# Patient Record
Sex: Female | Born: 1990 | Race: White | Hispanic: No | Marital: Married | Smoking: Never smoker
Health system: Southern US, Community
[De-identification: ages and names within clinical notes are randomized; demographics above are authoritative.]

## PROBLEM LIST (undated history)

## (undated) ENCOUNTER — Emergency Department (HOSPITAL_COMMUNITY): Discharge status: Absent without leave | Payer: Medicaid Other

## (undated) ENCOUNTER — Inpatient Hospital Stay: Payer: Self-pay

## (undated) DIAGNOSIS — T782XXA Anaphylactic shock, unspecified, initial encounter: Secondary | ICD-10-CM

## (undated) DIAGNOSIS — Z9289 Personal history of other medical treatment: Secondary | ICD-10-CM

## (undated) HISTORY — DX: Personal history of other medical treatment: Z92.89

## (undated) HISTORY — PX: NO PAST SURGERIES: SHX2092

---

## 2011-11-08 ENCOUNTER — Emergency Department: Payer: Self-pay | Admitting: Emergency Medicine

## 2012-07-27 ENCOUNTER — Observation Stay: Payer: Self-pay

## 2012-08-20 ENCOUNTER — Inpatient Hospital Stay: Payer: Self-pay | Admitting: Obstetrics and Gynecology

## 2012-08-20 LAB — CBC WITH DIFFERENTIAL/PLATELET
Basophil #: 0.1 10*3/uL (ref 0.0–0.1)
HCT: 35.5 % (ref 35.0–47.0)
HGB: 12.3 g/dL (ref 12.0–16.0)
Lymphocyte #: 2.6 10*3/uL (ref 1.0–3.6)
Lymphocyte %: 17.6 %
MCHC: 34.8 g/dL (ref 32.0–36.0)
MCV: 91 fL (ref 80–100)
Monocyte #: 1.3 x10 3/mm — ABNORMAL HIGH (ref 0.2–0.9)
Monocyte %: 8.5 %
Neutrophil #: 10.8 10*3/uL — ABNORMAL HIGH (ref 1.4–6.5)
Neutrophil %: 72.5 %
RDW: 13.6 % (ref 11.5–14.5)

## 2012-08-21 LAB — HEMATOCRIT: HCT: 34.9 % — ABNORMAL LOW (ref 35.0–47.0)

## 2014-06-19 NOTE — H&P (Signed)
L&D Evaluation:  History Expanded:  HPI 24 year old G1 P0 with EDC=08/13/2012 by LMP=11/07/2011 presented at 41 weeks with c/o SROM at 0300 this am. No VB, she is beginning to have contractions that are mildly painful. +FM. [facility] has been uncomplicated with early US confirming dates.   Blood Type (Maternal) A positive   Group B Strep Results Maternal (Result >5wks must be treated as unknown) negative   Maternal HIV Negative   Maternal Syphilis Ab Nonreactive   Maternal Varicella Immune   Rubella Results (Maternal) immune   Presents with leaking fluid   Patient's Medical History No Chronic Illness   Patient's Surgical History none   Medications Pre Natal Vitamins   Allergies NKDA, Fish   Social History none   Family History Non-Contributory   Exam:  Vital Signs stable   Urine Protein not completed   General no apparent distress   Mental Status clear   Chest clear   Heart no murmur/gallop/rubs   Abdomen gravid, tender with contractions   Estimated Fetal Weight Average for gestational age   Fetal Position cephalic   Edema no edema   Pelvic no external lesions, 2.5/60/-2   Mebranes Intact   FHT category 1 tracing   Ucx regular   Skin dry   Impression:  Impression SROM   Plan:  Comments Pt undecided about pain management plans at this point. Will ambulate in hallways with intermittant monitoring to assist in strengthening contractions.   Follow Up Appointment in 2 days.   Electronic Signatures: Vella KohlerBrothers, Catera Hankins K (CNM)  (Signed 12-Jul-14 10:38)  Authored: L&D Evaluation   Last Updated: 12-Jul-14 10:38 by Vella KohlerBrothers, Daeveon Zweber K (CNM)

## 2014-06-19 NOTE — H&P (Signed)
L&D Evaluation:  History:  HPI 24 year old G1 P0 with EDC=08/13/2012 by LMP=11/07/2011 pre18sented at 3537 4/[redacted] weeks gestation  s/p fall last night around 10 PM. She tripped over a concrete step falling onto her hands, knees and abdomen. She has not had VB or LOF. Baby not moving as much this AM as usual. Palms and and left knee with abrasions. No bruising of abdomen. Some discomfort in round ligament area. Occasional contraction/tightening. [facility] with early US confirming dates.   Presents with s/p fall for monitoring   Patient's Medical History No Chronic Illness   Medications Pre Natal Vitamins   Allergies NKDA   Social History none   Family History Non-Contributory   Exam:  Vital Signs stable  110/72   Urine Protein not completed   General no apparent distress   Mental Status clear   Abdomen gravid, non-tender   Estimated Fetal Weight Average for gestational age   Fetal Position cephalic   Edema no edema   Mebranes Intact, AFI=3.07+3.54+4.13+2.41cm=13.15cm   FHT 145-150 with accels to 160s to 170s. Baby became active during last half of monitoring   FHT Description MOderate variability   Ucx occasional   Skin dry, abrasions on both palms and left knee with abrasion and swelling. Wounds clean   Other Placenta fundal and posterior-no obvious abnormalities.   Impression:  Impression IUP at 37 4/7 week s/p fall with no evidence of abruption or fetal compromise.   Plan:  Plan DC home. FKCs daily. Discussed using Dermoplast or Neosporin for pain on abrasions and cleaning with soap and water daily.   Follow Up Appointment in 2 days.   Electronic Signatures: Trinna BalloonGutierrez, Dream Harman L (CNM)  (Signed 18-Jun-14 23:30)  Authored: L&D Evaluation   Last Updated: 18-Jun-14 23:30 by Trinna BalloonGutierrez, Ellah Otte L (CNM)

## 2015-02-10 NOTE — L&D Delivery Note (Signed)
Delivery Note At 12:19 AM a viable female was delivered via Vaginal, Spontaneous Delivery (Presentation: OA).  APGAR: 8, 9; weight: pending  .   Placenta status: delivered spontaneous, intact.  Cord: 3VC without complications.    Anesthesia:  Epidural Episiotomy: None Lacerations: 1st degree;Vaginal Suture Repair: 4-0 vicryl Est. Blood Loss (mL): 400  Mom to postpartum.  Baby to Couplet care / Skin to Skin.  Called to see patient.  Mom pushed to deliver a viable female infant.  The head followed by shoulders, which delivered without difficulty, and the rest of the body.  A single, tight nuchal cord noted.  Baby to mom's chest.  Cord clamped and cut after > 1 min delay.  No cord blood obtained.  Placenta delivered spontaneously, intact, with a 3-vessel cord.  Very small, bleeding first degree vaginal laceration repaired with 4-0 Vicryl with figure-of-eight.  All counts correct.  Hemostasis obtained with IV pitocin and fundal massage. EBL 400 mL.    Conard NovakJackson, Stephen D 11/29/2015, 12:56 AM

## 2015-04-11 LAB — HM PAP SMEAR: HM PAP: NEGATIVE

## 2015-10-15 ENCOUNTER — Observation Stay
Admission: EM | Admit: 2015-10-15 | Discharge: 2015-10-16 | Disposition: A | Discharge status: Home / Self Care | Payer: No Typology Code available for payment source | Attending: Obstetrics & Gynecology | Admitting: Obstetrics & Gynecology

## 2015-10-15 ENCOUNTER — Emergency Department
Admission: EM | Admit: 2015-10-15 | Discharge: 2015-10-15 | Disposition: A | Discharge status: Home / Self Care | Payer: No Typology Code available for payment source | Source: Home / Self Care | Attending: Emergency Medicine | Admitting: Emergency Medicine

## 2015-10-15 ENCOUNTER — Encounter: Payer: Self-pay | Admitting: Emergency Medicine

## 2015-10-15 DIAGNOSIS — Y939 Activity, unspecified: Secondary | ICD-10-CM | POA: Insufficient documentation

## 2015-10-15 DIAGNOSIS — Y999 Unspecified external cause status: Secondary | ICD-10-CM | POA: Insufficient documentation

## 2015-10-15 DIAGNOSIS — O26893 Other specified pregnancy related conditions, third trimester: Secondary | ICD-10-CM

## 2015-10-15 DIAGNOSIS — Y92410 Unspecified street and highway as the place of occurrence of the external cause: Secondary | ICD-10-CM

## 2015-10-15 DIAGNOSIS — Z3A35 35 weeks gestation of pregnancy: Secondary | ICD-10-CM | POA: Insufficient documentation

## 2015-10-15 DIAGNOSIS — R1030 Lower abdominal pain, unspecified: Secondary | ICD-10-CM

## 2015-10-15 DIAGNOSIS — Z3483 Encounter for supervision of other normal pregnancy, third trimester: Principal | ICD-10-CM | POA: Insufficient documentation

## 2015-10-15 DIAGNOSIS — Z041 Encounter for examination and observation following transport accident: Secondary | ICD-10-CM | POA: Insufficient documentation

## 2015-10-15 DIAGNOSIS — Z3A34 34 weeks gestation of pregnancy: Secondary | ICD-10-CM | POA: Insufficient documentation

## 2015-10-15 LAB — URINALYSIS COMPLETE WITH MICROSCOPIC (ARMC ONLY)
BACTERIA UA: NONE SEEN
BILIRUBIN URINE: NEGATIVE
Glucose, UA: NEGATIVE mg/dL
Hgb urine dipstick: NEGATIVE
Ketones, ur: NEGATIVE mg/dL
Leukocytes, UA: NEGATIVE
Nitrite: NEGATIVE
PH: 6 (ref 5.0–8.0)
Protein, ur: NEGATIVE mg/dL
RBC / HPF: NONE SEEN RBC/hpf (ref 0–5)
Specific Gravity, Urine: 1.01 (ref 1.005–1.030)
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
## 2015-10-15 NOTE — ED Notes (Signed)
Report given to [employer] RN in L&D. Patient transported upstairs via WC.
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
## 2015-10-15 NOTE — Discharge Instructions (Signed)
Proceed to labor and delivery area for monitoring.
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
## 2015-10-16 NOTE — Discharge Instructions (Signed)
Return to hospital for contractions, leaking of fluid, vaginal bleeding, or decreased fetal movement.

## 2015-11-28 ENCOUNTER — Inpatient Hospital Stay: Payer: Medicaid Other | Admitting: Anesthesiology

## 2015-11-28 ENCOUNTER — Encounter: Payer: Self-pay | Admitting: *Deleted

## 2015-11-28 ENCOUNTER — Inpatient Hospital Stay
Admission: EM | Admit: 2015-11-28 | Discharge: 2015-11-30 | DRG: 775 | Disposition: A | Discharge status: Home / Self Care | Payer: Medicaid Other | Attending: Obstetrics and Gynecology | Admitting: Obstetrics and Gynecology

## 2015-11-28 DIAGNOSIS — O480 Post-term pregnancy: Secondary | ICD-10-CM

## 2015-11-28 DIAGNOSIS — Z3A41 41 weeks gestation of pregnancy: Secondary | ICD-10-CM | POA: Diagnosis not present

## 2015-11-28 DIAGNOSIS — O4693 Antepartum hemorrhage, unspecified, third trimester: Secondary | ICD-10-CM | POA: Diagnosis present

## 2015-11-28 DIAGNOSIS — Z8249 Family history of ischemic heart disease and other diseases of the circulatory system: Secondary | ICD-10-CM | POA: Diagnosis not present

## 2015-11-28 LAB — CBC
HCT: 37.6 % (ref 35.0–47.0)
Hemoglobin: 13 g/dL (ref 12.0–16.0)
MCH: 31.4 pg (ref 26.0–34.0)
MCHC: 34.5 g/dL (ref 32.0–36.0)
MCV: 91.1 fL (ref 80.0–100.0)
PLATELETS: 214 10*3/uL (ref 150–440)
RBC: 4.13 MIL/uL (ref 3.80–5.20)
RDW: 13.9 % (ref 11.5–14.5)
WBC: 12.6 10*3/uL — AB (ref 3.6–11.0)

## 2015-11-28 LAB — TYPE AND SCREEN
ABO/RH(D): A POS
Antibody Screen: NEGATIVE

## 2015-11-28 MED ORDER — ONDANSETRON HCL 4 MG/2ML IJ SOLN: 4.0000 mg | Freq: Four times a day (QID) | INTRAMUSCULAR | Status: DC | PRN

## 2015-11-28 MED ORDER — OXYTOCIN BOLUS FROM INFUSION
500.0000 mL | Freq: Once | INTRAVENOUS | Status: AC
  Administered 2015-11-29: 500 mL via INTRAVENOUS

## 2015-11-28 MED ORDER — LACTATED RINGERS IV SOLN: INTRAVENOUS | Status: DC

## 2015-11-28 MED ORDER — LACTATED RINGERS IV SOLN: 500.0000 mL | INTRAVENOUS | Status: DC | PRN

## 2015-11-28 MED ORDER — TERBUTALINE SULFATE 1 MG/ML IJ SOLN: 0.2500 mg | Freq: Once | INTRAMUSCULAR | Status: DC | PRN

## 2015-11-28 MED ORDER — OXYTOCIN 10 UNIT/ML IJ SOLN
INTRAMUSCULAR | Status: AC
  Filled 2015-11-28: qty 2

## 2015-11-28 MED ORDER — LIDOCAINE HCL (PF) 1 % IJ SOLN
30.0000 mL | INTRAMUSCULAR | Status: AC | PRN
  Administered 2015-11-28: 1 mL via SUBCUTANEOUS

## 2015-11-28 MED ORDER — SOD CITRATE-CITRIC ACID 500-334 MG/5ML PO SOLN
30.0000 mL | ORAL | Status: DC | PRN
  Filled 2015-11-28: qty 30

## 2015-11-28 MED ORDER — OXYTOCIN 40 UNITS IN LACTATED RINGERS INFUSION - SIMPLE MED: 1.0000 m[IU]/min | INTRAVENOUS | Status: DC

## 2015-11-28 MED ORDER — FENTANYL 2.5 MCG/ML W/ROPIVACAINE 0.2% IN NS 100 ML EPIDURAL INFUSION (ARMC-ANES)
EPIDURAL | Status: DC | PRN
  Administered 2015-11-28: 10 mL/h via EPIDURAL

## 2015-11-28 MED ORDER — LIDOCAINE HCL (PF) 1 % IJ SOLN
INTRAMUSCULAR | Status: AC
  Filled 2015-11-28: qty 30

## 2015-11-28 MED ORDER — MISOPROSTOL 200 MCG PO TABS
ORAL_TABLET | ORAL | Status: AC
  Filled 2015-11-28: qty 4

## 2015-11-28 MED ORDER — FENTANYL 2.5 MCG/ML W/ROPIVACAINE 0.2% IN NS 100 ML EPIDURAL INFUSION (ARMC-ANES)
EPIDURAL | Status: AC
  Filled 2015-11-28: qty 100

## 2015-11-28 MED ORDER — AMMONIA AROMATIC IN INHA
RESPIRATORY_TRACT | Status: AC
  Filled 2015-11-28: qty 10

## 2015-11-28 MED ORDER — OXYTOCIN 40 UNITS IN LACTATED RINGERS INFUSION - SIMPLE MED
2.5000 [IU]/h | INTRAVENOUS | Status: DC
  Filled 2015-11-28: qty 1000

## 2015-11-28 MED ORDER — OXYTOCIN 10 UNIT/ML IJ SOLN: 10.0000 [IU] | Freq: Once | INTRAMUSCULAR | Status: DC

## 2015-11-28 NOTE — Anesthesia Procedure Notes (Signed)
Epidural Patient location during procedure: OB Start time: 11/28/2015 6:52 PM End time: 11/28/2015 7:16 PM  Preanesthetic Checklist Completed: patient identified, site marked, surgical consent, pre-op evaluation, timeout performed, IV checked, risks and benefits discussed and monitors and equipment checked  Epidural Patient position: sitting Prep: Betadine Patient monitoring: heart rate, continuous pulse ox and blood pressure Approach: midline Location: L4-L5 Injection technique: LOR saline  Needle:  Needle type: Tuohy  Needle gauge: 17 G Needle length: 9 cm and 9 Catheter type: closed end flexible Catheter size: 19 Gauge Test dose: negative and 1.5% lidocaine with Epi 1:200 K  Assessment Events: blood not aspirated, injection not painful, no injection resistance, negative IV test and no paresthesia  Additional Notes   Patient tolerated the insertion well without complications.Reason for block:procedure for pain

## 2015-11-28 NOTE — H&P (Signed)
OB History & Physical   History of Present Illness:  Chief Complaint: Vaginal bleeding  HPI:  Cristina Le is a 25 y.o. G18P1001 female at [redacted]w[redacted]d dated by her LMP consistent with a 9 week ultrasoudn.  Her pregnancy has been uncomplicated.    She denies contractions.   She denies leakage of fluid.   She reports vaginal bleeding.   She reports fetal movement.   She noted bright red vaginal bleeding this morning at 11 am. She had no provoking incident (no intercourse, etc). She had a cervical exam yesterday, but has had no bleeding since that time.  The states that she does not see a large amount of blood in her underwear.  However, when she wipes after using the bathroom she sees a decent amount on the tissue.  Denies clots.  Denies abdominal pain. Denies drug use, smoking, recent abdominal trauma.    Maternal Medical History:   Past Medical History:  Diagnosis Date  . Medical history non-contributory     Past Surgical History:  Procedure Laterality Date  . NO PAST SURGERIES      Allergies  Allergen Reactions  . Fish Allergy Anaphylaxis    Prior to Admission medications   Medication Sig Start Date End Date Taking? Authorizing Provider  ferrous sulfate (FER-IN-SOL) 75 (15 Fe) MG/ML SOLN Take by mouth.    Historical Provider, MD  Prenatal Vit-Fe Fumarate-FA (MULTIVITAMIN-PRENATAL) 27-0.8 MG TABS tablet Take 1 tablet by mouth daily at 12 noon.    Historical Provider, MD    OB History  Gravida Para Term Preterm AB Living  2 1 1  0 0 1  SAB TAB Ectopic Multiple Live Births  0 0 0 0 1    # Outcome Date GA Lbr Len/2nd Weight Sex Delivery Anes PTL Lv  2 Current           1 Term 08/20/12    F Vag-Spont EPI N LIV      Prenatal care site: [facility]  Social History: She  reports that she has never smoked. She has never used smokeless tobacco. She reports that she does not drink alcohol or use drugs.  Family History: family history includes Breast cancer in her maternal  grandmother; Heart disease in her paternal grandfather.   Review of Systems: Negative x 10 systems reviewed except as noted in the HPI.    Physical Exam:  Vital Signs: BP 115/75 (BP Location: Left Arm)   Pulse 85   Temp 97.5 F (36.4 C) (Oral)   Ht 5\' 8"  (1.727 m)   Wt 180 lb (81.6 kg)   BMI 27.37 kg/m  General: no acute distress.  HEENT: normocephalic, atraumatic Heart: regular rate & rhythm.  No murmurs/rubs/gallops Lungs: clear to auscultation bilaterally Abdomen: soft, gravid, non-tender;  EFW: 7 pounds Pelvic: (female chaperone present during pelvic exam)  External: Normal external female genitalia  Cervix: Dilation: Fingertip / Effacement (%): Thick / Station: -3   ROM: neg pooling; positive for bright red blood in posterior fornix.  No discernible active bleeding from cervix or cervical os. However, after wiping away blood, a small new pool of bright red blood would form. Extremities: non-tender, symmetric, no edema bilaterally.  DTRs: 2+  Neurologic: Alert & oriented x 3.    Pertinent Results:  Prenatal Labs: Blood type/Rh A positive  Antibody screen negative  Rubella Immune  Varicella Immune    RPR NR  HBsAg negative  HIV negative  GC negative  Chlamydia negative  Genetic screening  1st trimester screen negative  1 hour GTT 89  3 hour GTT N/a  GBS negative on 10/24/15   Baseline FHR: 125 beats/min   Variability: moderate   Accelerations: present   Decelerations: absent Contractions: absent frequency: n/a Overall assessment: category 1  Assessment:  [name] is a 25 y.o. 132P1001 female at 3688w1d with active vaginal bleeding of unknown source.  Possible sources could be cervical versus uterine.  In either case, will go ahead and deliver as a negative ultrasound would not rule out placenta abruptio.  Discussed risk of induction vs expectant management.  Discussed possible need for emergent cesarean delivery for worsening maternal or fetal condition.   Plan:   1. Admit to Labor & Delivery  2. CBC, T&S, NPO, IVF 3. GBS negative.   4. Fetwal well-being: reassuring overall 5. IOL, as above.   Conard NovakJackson, Stephen D, MD 11/28/2015 1:58 PM

## 2015-11-28 NOTE — Progress Notes (Signed)
BSUS performed. Fetus is cephalic. Unable to visualize the placenta well due to shadowing effect of fetus. Fluid subjectively normal.   Thomasene MohairStephen Doneisha Ivey, MD 11/28/2015 2:45 PM

## 2015-11-28 NOTE — Anesthesia Preprocedure Evaluation (Signed)
Anesthesia Evaluation  Patient identified by MRN, date of birth, ID band Patient awake    Reviewed: Allergy & Precautions, NPO status , Patient's Chart, lab work & pertinent test results  History of Anesthesia Complications Negative for: history of anesthetic complications  Airway Mallampati: II       Dental   Pulmonary neg pulmonary ROS,           Cardiovascular negative cardio ROS       Neuro/Psych negative neurological ROS     GI/Hepatic negative GI ROS, Neg liver ROS,   Endo/Other  negative endocrine ROS  Renal/GU negative Renal ROS     Musculoskeletal   Abdominal   Peds  Hematology negative hematology ROS (+)   Anesthesia Other Findings   Reproductive/Obstetrics                             Anesthesia Physical Anesthesia Plan  ASA: II  Anesthesia Plan: Epidural   Post-op Pain Management:    Induction:   Airway Management Planned:   Additional Equipment:   Intra-op Plan:   Post-operative Plan:   Informed Consent: I have reviewed the patients History and Physical, chart, labs and discussed the procedure including the risks, benefits and alternatives for the proposed anesthesia with the patient or authorized representative who has indicated his/her understanding and acceptance.     Plan Discussed with:   Anesthesia Plan Comments:         Anesthesia Quick Evaluation

## 2015-11-28 NOTE — Progress Notes (Signed)
Labor Check  Subj:  Complaints: Worsening pain with contractions, desires epidural.   Obj:  BP 115/75 (BP Location: Left Arm)   Pulse 85   Temp 97.5 F (36.4 C) (Oral)   Resp 18   Ht 5\' 8"  (1.727 m)   Wt 180 lb (81.6 kg)   BMI 27.37 kg/m     Cervix: Dilation: 5 / Effacement (%): 90 / Station: -2  Baseline FHR: 130    Variability: moderate    Accelerations: present    Decelerations: absent Contractions: present frequency: 4 q 10 min  A/P: 25 y.o. G2P1001 female at 3717w1d with vaginal bleeding.  1.  Labor: continue pitocin per protocol. Currently on 1.  2.  FWB: reassuring, Overall assessment: category 1  3.  GBS negative  4.  Pain: prn, may have epidural any time 5.  Recheck: prn.   Thomasene MohairStephen Tre Sanker, MD 11/28/2015 6:32 PM

## 2015-11-28 NOTE — Progress Notes (Signed)
Patient ID: Cristina Le, female   DOB: 1990-02-27, 25 y.o.   MRN: [id number]  Labor Check  Subj:  Complaints: patient thinks her water broke. Patient was lying in bed when large gush of fluid was noted. It was red in color and was without clots.  Obj:  BP 115/75 (BP Location: Left Arm)   Pulse 85   Temp 97.5 F (36.4 C) (Oral)   Resp 18   Ht 5\' 8"  (1.727 m)   Wt 180 lb (81.6 kg)   BMI 27.37 kg/m     Cervix: Dilation: 2 / Effacement (%): 80 / Station: -2  Baseline FHR: 130    Variability: moderate    Accelerations: present    Decelerations: absent Contractions: present frequency: 3-4 q 10 min  Perineum with some thin fluid, with port-wine coloring nitrazine not done due to blood Ferning: equiv  A/P: 25 y.o. G2P1001 female at 20102w1d with IOL for vaginal bleeding.  1.  Labor: pitocin at 1. Patient contracting. Continue at lowest effective dose.  SROM likely. Will continue as if she has ROM. 2.  FWB: reactive, Overall assessment: category 1  3.  GBS negative  4.  Pain: prn. May get epidural when ready. 5.  Recheck: prn   Thomasene MohairStephen Oktober Glazer, MD 11/28/2015 5:04 PM

## 2015-11-29 LAB — RPR: RPR Ser Ql: NONREACTIVE

## 2015-11-29 MED ORDER — ONDANSETRON HCL 4 MG/2ML IJ SOLN: 4.0000 mg | Freq: Three times a day (TID) | INTRAMUSCULAR | Status: DC | PRN

## 2015-11-29 MED ORDER — SODIUM CHLORIDE 0.9% FLUSH: 3.0000 mL | INTRAVENOUS | Status: DC | PRN

## 2015-11-29 MED ORDER — DIPHENHYDRAMINE HCL 50 MG/ML IJ SOLN: 12.5000 mg | INTRAMUSCULAR | Status: DC | PRN

## 2015-11-29 MED ORDER — ACETAMINOPHEN 325 MG PO TABS
650.0000 mg | ORAL_TABLET | ORAL | Status: DC | PRN
  Administered 2015-11-30: 650 mg via ORAL
  Filled 2015-11-29: qty 2

## 2015-11-29 MED ORDER — NALOXONE HCL 0.4 MG/ML IJ SOLN: 0.4000 mg | INTRAMUSCULAR | Status: DC | PRN

## 2015-11-29 MED ORDER — NALOXONE HCL 2 MG/2ML IJ SOSY: 1.0000 ug/kg/h | PREFILLED_SYRINGE | INTRAVENOUS | Status: DC | PRN

## 2015-11-29 MED ORDER — IBUPROFEN 600 MG PO TABS
600.0000 mg | ORAL_TABLET | Freq: Four times a day (QID) | ORAL | Status: DC | PRN
  Administered 2015-11-29: 600 mg via ORAL
  Filled 2015-11-29: qty 1

## 2015-11-29 MED ORDER — ONDANSETRON HCL 4 MG PO TABS: 4.0000 mg | ORAL_TABLET | ORAL | Status: DC | PRN

## 2015-11-29 MED ORDER — HYDROCODONE-ACETAMINOPHEN 5-325 MG PO TABS: 1.0000 | ORAL_TABLET | Freq: Four times a day (QID) | ORAL | Status: DC | PRN

## 2015-11-29 MED ORDER — BENZOCAINE-MENTHOL 20-0.5 % EX AERO: 1.0000 "application " | INHALATION_SPRAY | CUTANEOUS | Status: DC | PRN

## 2015-11-29 MED ORDER — NALBUPHINE HCL 10 MG/ML IJ SOLN: 5.0000 mg | Freq: Once | INTRAMUSCULAR | Status: DC | PRN

## 2015-11-29 MED ORDER — DIPHENHYDRAMINE HCL 25 MG PO CAPS: 25.0000 mg | ORAL_CAPSULE | Freq: Four times a day (QID) | ORAL | Status: DC | PRN

## 2015-11-29 MED ORDER — FENTANYL 2.5 MCG/ML W/ROPIVACAINE 0.2% IN NS 100 ML EPIDURAL INFUSION (ARMC-ANES): 10.0000 mL/h | EPIDURAL | Status: DC

## 2015-11-29 MED ORDER — WITCH HAZEL-GLYCERIN EX PADS: 1.0000 "application " | MEDICATED_PAD | CUTANEOUS | Status: DC | PRN

## 2015-11-29 MED ORDER — COCONUT OIL OIL
1.0000 "application " | TOPICAL_OIL | Status: DC | PRN
  Administered 2015-11-30: 1 via TOPICAL
  Filled 2015-11-29: qty 120

## 2015-11-29 MED ORDER — NALBUPHINE HCL 10 MG/ML IJ SOLN: 5.0000 mg | INTRAMUSCULAR | Status: DC | PRN

## 2015-11-29 MED ORDER — DIPHENHYDRAMINE HCL 25 MG PO CAPS: 25.0000 mg | ORAL_CAPSULE | ORAL | Status: DC | PRN

## 2015-11-29 MED ORDER — ONDANSETRON HCL 4 MG/2ML IJ SOLN: 4.0000 mg | INTRAMUSCULAR | Status: DC | PRN

## 2015-11-29 MED ORDER — DIBUCAINE 1 % RE OINT: 1.0000 "application " | TOPICAL_OINTMENT | RECTAL | Status: DC | PRN

## 2015-11-29 MED ORDER — FERROUS SULFATE 325 (65 FE) MG PO TABS
325.0000 mg | ORAL_TABLET | Freq: Two times a day (BID) | ORAL | Status: DC
  Administered 2015-11-29 – 2015-11-30 (×3): 325 mg via ORAL
  Filled 2015-11-29 (×3): qty 1

## 2015-11-29 MED ORDER — SIMETHICONE 80 MG PO CHEW: 80.0000 mg | CHEWABLE_TABLET | ORAL | Status: DC | PRN

## 2015-11-29 MED ORDER — SCOPOLAMINE 1 MG/3DAYS TD PT72: 1.0000 | MEDICATED_PATCH | Freq: Once | TRANSDERMAL | Status: DC

## 2015-11-29 MED ORDER — SENNOSIDES-DOCUSATE SODIUM 8.6-50 MG PO TABS
2.0000 | ORAL_TABLET | ORAL | Status: DC
  Administered 2015-11-30: 2 via ORAL
  Filled 2015-11-29: qty 2

## 2015-11-29 MED ORDER — IBUPROFEN 600 MG PO TABS
600.0000 mg | ORAL_TABLET | Freq: Four times a day (QID) | ORAL | Status: DC
  Administered 2015-11-29 – 2015-11-30 (×5): 600 mg via ORAL
  Filled 2015-11-29 (×5): qty 1

## 2015-11-29 NOTE — Progress Notes (Signed)
Post Partum Day 0 Subjective: Doing well, no complaints.  Tolerating regular diet, pain with PO meds, voiding and ambulating without difficulty.  No CP SOB F/C N/V or leg pain No HA, change of vision, RUQ/epigastric pain  Objective: BP 112/70   Pulse 79   Temp 97.9 F (36.6 C) (Oral)   Resp 18   Ht 5\' 8"  (1.727 m)   Wt 180 lb (81.6 kg)   SpO2 99%   BMI 27.37 kg/m   Physical Exam:  General: NAD CV: RRR Pulm: nl effort, CTABL Lochia: moderate Uterine Fundus: fundus firm and below umbilicus DVT Evaluation: no cords, ttp LEs    Recent Labs  11/28/15 1454  HGB 13.0  HCT 37.6  WBC 12.6*  PLT 214    Assessment/Plan: 25 y.o. G2P1001 postpartum day # 0  1. Continue routine postpartum care 2. A+, Rubella Immune, Varicella Immune 3. TDAP status: declined 4. Breast/Contraception: uncertain 5. Disposition: Discharge to home Day 1 or 2    Taran Haynesworth, CNM

## 2015-11-29 NOTE — Discharge Summary (Signed)
OB Discharge Summary     Patient Name: Cristina Le DOB: 1990-03-19 MRN: [id number]  Date of admission: 11/28/2015 Delivering MD: Conard Novak, MD  Date of Delivery: 11/29/2015  Date of discharge: 11/30/15  Admitting diagnosis:  1) intrauterine device at [redacted]w[redacted]d  2) vaginal bleeding  Intrauterine pregnancy: [redacted]w[redacted]d      Secondary diagnosis: None     Discharge diagnosis: Term Pregnancy Delivered                                                                                                Post partum procedures:none  Augmentation: Pitocin  Complications: None  Hospital course:  Induction of Labor With Vaginal Delivery   25 y.o. yo G2P1001 at [redacted]w[redacted]d was admitted to the hospital 11/28/2015 for induction of labor.  Indication for induction: Postdates and vaginal bleedin.  Patient was admitted and pitocin was started.  Her vaginal bleeding was mild, but persistent.  She otherwise had an uncomplicated labor course as follows: Membrane Rupture Time/Date: 12:03 AM ,11/29/2015   Intrapartum Procedures: Episiotomy: None [1]                                         Lacerations:  1st degree [2];Vaginal [6]  Patient had delivery of a Viable infant.  Information for the patient's newborn:  Michaelyn, Wall [[id number]]  Delivery Method: Vaginal, Spontaneous Delivery (Filed from Delivery Summary)   11/29/2015  Details of delivery can be found in separate delivery note.  Patient had a routine postpartum course. Patient is discharged home 11/29/15.   Physical exam  Vitals:   11/29/15 1553 11/29/15 1859 11/30/15 0746 11/30/15 0759  BP: 99/65 106/63 97/69 (!) 95/56  Pulse: 82 80 (!) 119 72  Resp: 18 20 18 18   Temp: 98 F (36.7 C) 98.8 F (37.1 C) 97.5 F (36.4 C) 98.1 F (36.7 C)  TempSrc: Oral Oral Oral Oral  SpO2:  100% 99% 99%  Weight:      Height:       General: alert, cooperative and no distress Lochia: appropriate Uterine Fundus: firm Incision: N/A DVT Evaluation:  No evidence of DVT seen on physical exam. No cords or calf tenderness. No significant calf/ankle edema.  Labs: Lab Results  Component Value Date   WBC 13.6 (H) 11/30/2015   HGB 12.6 11/30/2015   HCT 37.9 11/30/2015   MCV 93.7 11/30/2015   PLT 201 11/30/2015   No flowsheet data found.  Discharge instruction: per After Visit Summary.  Medications:    Medication List    STOP taking these medications   ferrous sulfate 75 (15 Fe) MG/ML Soln Commonly known as:  FER-IN-SOL     TAKE these medications   ibuprofen 600 MG tablet Commonly known as:  ADVIL,MOTRIN Take 1 tablet (600 mg total) by mouth every 6 (six) hours.   multivitamin-prenatal 27-0.8 MG Tabs tablet Take 1 tablet by mouth daily at 12 noon.        Diet: routine diet  Activity: Advance as tolerated.  Pelvic rest for 6 weeks.   Outpatient follow up: Follow-up Information    Conard NovakJackson, Stephen D, MD Follow up in 6 week(s).   Specialty:  Obstetrics and Gynecology Why:  postpartum check Contact information: [address] [place] [address] [phone]             Postpartum contraception: Undecided Rhogam Given postpartum: no Rubella vaccine given postpartum: no Varicella vaccine given postpartum: no TDaP given antepartum or postpartum: Declined  Newborn Data: Live born female  Birth Weight:  3840 grams APGAR: 8, 9   Baby Feeding: Breast  Disposition:home with mother  SIGNED: Marta AntuBrothers, Evans Levee, Ina HomesCNM 11/30/15 10:11 AM  Seen with L. Zachery ConchFriedman, SNM [facility]

## 2015-11-29 NOTE — Lactation Note (Signed)
This note was copied from a baby's chart. Lactation Consultation Note  Patient Name: Boy Karie Weinman JXBJY'NToday's Date: 11/29/2015 Sheryn Bison    Maternal Data  Did not observe bresatfeeding, pt breastfed first child for 1 yr, states this baby is latching and nursing well and mom hears swallows Feeding Feeding Type: Breast Fed  [facility]                      Lactation Tools Discussed/Used     Consult Status      Dyann KiefMarsha D Thedford Bunton 11/29/2015, 5:55 PM

## 2015-11-30 LAB — CBC
HCT: 37.9 % (ref 35.0–47.0)
HEMOGLOBIN: 12.6 g/dL (ref 12.0–16.0)
MCH: 31.1 pg (ref 26.0–34.0)
MCHC: 33.2 g/dL (ref 32.0–36.0)
MCV: 93.7 fL (ref 80.0–100.0)
PLATELETS: 201 10*3/uL (ref 150–440)
RBC: 4.05 MIL/uL (ref 3.80–5.20)
RDW: 13.8 % (ref 11.5–14.5)
WBC: 13.6 10*3/uL — ABNORMAL HIGH (ref 3.6–11.0)

## 2015-11-30 MED ORDER — IBUPROFEN 600 MG PO TABS: 600.0000 mg | ORAL_TABLET | Freq: Four times a day (QID) | ORAL | 0 refills | Status: DC

## 2015-11-30 NOTE — Anesthesia Postprocedure Evaluation (Signed)
Anesthesia Post Note  Patient: Cristina Le  Procedure(s) Performed: * No procedures listed *  Patient location during evaluation: Mother Baby Anesthesia Type: Epidural Level of consciousness: awake and alert Pain management: pain level controlled Vital Signs Assessment: post-procedure vital signs reviewed and stable Respiratory status: spontaneous breathing, nonlabored ventilation and respiratory function stable Cardiovascular status: stable Postop Assessment: no headache, no backache and patient able to bend at knees Anesthetic complications: no    Last Vitals:  Vitals:   11/30/15 0746 11/30/15 0759  BP: 97/69 (!) 95/56  Pulse: (!) 119 72  Resp: 18 18  Temp: 36.4 C 36.7 C    Last Pain:  Vitals:   11/30/15 0759  TempSrc: Oral  PainSc: 1                  Cleda MccreedyJoseph K Keslie Gritz

## 2015-11-30 NOTE — Progress Notes (Signed)
Patient discharged home with infant and spouse. Discharge instructions, prescriptions and follow up appointment given to and reviewed with patient and spouse. Patient verbalized understanding. Escorted out via wheelchair by United AutoMelissa Penland, NT.

## 2017-01-01 ENCOUNTER — Encounter: Payer: Self-pay | Admitting: Emergency Medicine

## 2017-01-01 ENCOUNTER — Emergency Department
Admission: EM | Admit: 2017-01-01 | Discharge: 2017-01-01 | Disposition: A | Discharge status: Home / Self Care | Payer: Medicaid Other | Attending: Emergency Medicine | Admitting: Emergency Medicine

## 2017-01-01 DIAGNOSIS — O99712 Diseases of the skin and subcutaneous tissue complicating pregnancy, second trimester: Secondary | ICD-10-CM | POA: Diagnosis not present

## 2017-01-01 DIAGNOSIS — O2686 Pruritic urticarial papules and plaques of pregnancy (PUPPP): Secondary | ICD-10-CM | POA: Diagnosis not present

## 2017-01-01 DIAGNOSIS — Z79899 Other long term (current) drug therapy: Secondary | ICD-10-CM | POA: Insufficient documentation

## 2017-01-01 DIAGNOSIS — R112 Nausea with vomiting, unspecified: Secondary | ICD-10-CM | POA: Diagnosis not present

## 2017-01-01 DIAGNOSIS — T782XXA Anaphylactic shock, unspecified, initial encounter: Secondary | ICD-10-CM | POA: Diagnosis not present

## 2017-01-01 DIAGNOSIS — R197 Diarrhea, unspecified: Secondary | ICD-10-CM | POA: Diagnosis not present

## 2017-01-01 DIAGNOSIS — O26891 Other specified pregnancy related conditions, first trimester: Secondary | ICD-10-CM | POA: Diagnosis not present

## 2017-01-01 DIAGNOSIS — Z3A11 11 weeks gestation of pregnancy: Secondary | ICD-10-CM | POA: Insufficient documentation

## 2017-01-01 DIAGNOSIS — O9989 Other specified diseases and conditions complicating pregnancy, childbirth and the puerperium: Secondary | ICD-10-CM | POA: Diagnosis not present

## 2017-01-01 DIAGNOSIS — T7840XA Allergy, unspecified, initial encounter: Secondary | ICD-10-CM | POA: Diagnosis not present

## 2017-01-01 LAB — CBC WITH DIFFERENTIAL/PLATELET
BASOS ABS: 0 10*3/uL (ref 0–0.1)
Basophils Relative: 0 %
Eosinophils Absolute: 0.1 10*3/uL (ref 0–0.7)
Eosinophils Relative: 0 %
HEMATOCRIT: 43.7 % (ref 35.0–47.0)
Hemoglobin: 14.8 g/dL (ref 12.0–16.0)
LYMPHS PCT: 10 %
Lymphs Abs: 1.9 10*3/uL (ref 1.0–3.6)
MCH: 30.3 pg (ref 26.0–34.0)
MCHC: 33.8 g/dL (ref 32.0–36.0)
MCV: 89.8 fL (ref 80.0–100.0)
Monocytes Absolute: 0.7 10*3/uL (ref 0.2–0.9)
Monocytes Relative: 4 %
NEUTROS ABS: 15.8 10*3/uL — AB (ref 1.4–6.5)
Neutrophils Relative %: 86 %
PLATELETS: 294 10*3/uL (ref 150–440)
RBC: 4.87 MIL/uL (ref 3.80–5.20)
RDW: 12.6 % (ref 11.5–14.5)
WBC: 18.5 10*3/uL — AB (ref 3.6–11.0)

## 2017-01-01 LAB — COMPREHENSIVE METABOLIC PANEL
ALT: 11 U/L — AB (ref 14–54)
AST: 16 U/L (ref 15–41)
Albumin: 3.9 g/dL (ref 3.5–5.0)
Alkaline Phosphatase: 36 U/L — ABNORMAL LOW (ref 38–126)
Anion gap: 10 (ref 5–15)
BILIRUBIN TOTAL: 0.4 mg/dL (ref 0.3–1.2)
BUN: 18 mg/dL (ref 6–20)
CHLORIDE: 104 mmol/L (ref 101–111)
CO2: 22 mmol/L (ref 22–32)
CREATININE: 0.8 mg/dL (ref 0.44–1.00)
Calcium: 9.1 mg/dL (ref 8.9–10.3)
GFR calc Af Amer: >60 mL/min (ref >60)
Glucose, Bld: 128 mg/dL — ABNORMAL HIGH (ref 65–99)
Potassium: 3.8 mmol/L (ref 3.5–5.1)
Sodium: 136 mmol/L (ref 135–145)
Total Protein: 7.1 g/dL (ref 6.5–8.1)

## 2017-01-01 LAB — LIPASE, BLOOD: LIPASE: 39 U/L (ref 11–51)

## 2017-01-01 MED ORDER — METHYLPREDNISOLONE SODIUM SUCC 125 MG IJ SOLR
125.0000 mg | Freq: Once | INTRAMUSCULAR | Status: AC
  Administered 2017-01-01: 125 mg via INTRAVENOUS
  Filled 2017-01-01: qty 2

## 2017-01-01 MED ORDER — EPINEPHRINE 0.3 MG/0.3ML IJ SOAJ
0.3000 mg | Freq: Once | INTRAMUSCULAR | Status: AC
  Administered 2017-01-01: 0.3 mg via INTRAMUSCULAR
  Filled 2017-01-01: qty 0.3

## 2017-01-01 MED ORDER — SODIUM CHLORIDE 0.9 % IV BOLUS (SEPSIS)
1000.0000 mL | INTRAVENOUS | Status: AC
  Administered 2017-01-01: 1000 mL via INTRAVENOUS

## 2017-01-01 MED ORDER — PREDNISONE 20 MG PO TABS: 60.0000 mg | ORAL_TABLET | Freq: Every day | ORAL | 0 refills | Status: DC

## 2017-01-01 MED ORDER — ONDANSETRON 4 MG PO TBDP: 4.0000 mg | ORAL_TABLET | Freq: Three times a day (TID) | ORAL | 0 refills | Status: DC | PRN

## 2017-01-01 MED ORDER — ONDANSETRON HCL 4 MG/2ML IJ SOLN
4.0000 mg | Freq: Once | INTRAMUSCULAR | Status: AC
  Administered 2017-01-01: 4 mg via INTRAVENOUS
  Filled 2017-01-01: qty 2

## 2017-01-01 MED ORDER — LOPERAMIDE HCL 2 MG PO CAPS
4.0000 mg | ORAL_CAPSULE | Freq: Once | ORAL | Status: AC
  Administered 2017-01-01: 4 mg via ORAL
  Filled 2017-01-01: qty 2

## 2017-01-01 MED ORDER — EPINEPHRINE 0.3 MG/0.3ML IJ SOAJ: 0.3000 mg | Freq: Once | INTRAMUSCULAR | 0 refills | Status: AC

## 2017-01-01 MED ORDER — PREDNISONE 10 MG (21) PO TBPK: ORAL_TABLET | Freq: Every day | ORAL | 0 refills | Status: DC

## 2017-01-01 MED ORDER — ONDANSETRON HCL 4 MG/2ML IJ SOLN
4.0000 mg | INTRAMUSCULAR | Status: AC
  Administered 2017-01-01: 4 mg via INTRAVENOUS
  Filled 2017-01-01: qty 2

## 2017-01-01 NOTE — Discharge Instructions (Signed)

## 2017-01-01 NOTE — ED Provider Notes (Signed)
[facility] Emergency Department Provider Note  ____________________________________________   First MD Initiated Contact with Patient 01/01/17 [id number]     (approximate)  I have reviewed the triage vital signs and the nursing notes.   HISTORY  Chief Complaint Allergic Reaction    HPI Cristina Le is a 26 y.o. female G3P2 at approximately [redacted] weeks gestation who [facility].  Presents by EMS for probable allergic reaction.  She reportedly had acute onset of nausea and vomiting and multiple episodes of diarrhea as well as having hives on her arms and legs.  EMS gave Benadryl 50 mg IV on the way to the hospital and her hives had resolved by the time I saw her.  She had at least 6 or 7 episodes of vomiting and 2 or 3 episodes of diarrhea.  She denies vaginal bleeding, chest pain, shortness of breath, throat swelling, tongue swelling, and dysuria.  She reportedly did have an episode of syncope or near syncope at home.  She states she has had 2 similar episodes in the past over the last few years.  She states that the hives were very itchy and the only thing to which she is allergic and of which she is aware is finished and she has not come in contact with fish tonight.  Her symptoms were severe and acute in onset but are improving after the Benadryl.  Nothing in particular made them worse.   Past Medical History:  Diagnosis Date  . Medical history non-contributory     Patient Active Problem List   Diagnosis Date Noted  . Labor and delivery, indication for care 11/28/2015  . Vaginal bleeding in pregnancy, third trimester 11/28/2015  . [redacted] weeks gestation of pregnancy 11/28/2015    Past Surgical History:  Procedure Laterality Date  . NO PAST SURGERIES      Prior to Admission medications   Medication Sig Start Date End Date Taking? Authorizing Provider  Prenatal Vit-Fe Fumarate-FA (MULTIVITAMIN-PRENATAL) 27-0.8 MG TABS tablet Take 1  tablet by mouth daily at 12 noon.   Yes [provider]  EPINEPHrine (EPIPEN 2-PAK) 0.3 mg/0.3 mL IJ SOAJ injection Inject 0.3 mLs (0.3 mg total) into the muscle once for 1 dose. Take for severe allergic reaction, then come immediately to the Emergency Department or call 911. 01/01/17 01/01/17  Loleta RoseForbach, Teleah Villamar, MD  predniSONE (DELTASONE) 20 MG tablet Take 3 tablets (60 mg total) by mouth daily. 01/01/17   Loleta RoseForbach, Zackary Mckeone, MD    Allergies Fish allergy; Fish-derived products; and Other  Family History  Problem Relation Age of Onset  . Breast cancer Maternal Grandmother   . Heart disease Paternal Grandfather     Social History Social History   Tobacco Use  . Smoking status: Never Smoker  . Smokeless tobacco: Never Used  Substance Use Topics  . Alcohol use: No  . Drug use: No    Review of Systems Constitutional: No fever/chills Cardiovascular: Denies chest pain. Respiratory: Denies shortness of breath. Gastrointestinal: Abdominal cramping with multiple episodes of vomiting and several episodes of diarrhea Genitourinary: Negative for dysuria. Musculoskeletal: Negative for neck pain.  Negative for back pain. Integumentary: Negative for rash. Neurological: Negative for headaches, focal weakness or numbness.   ____________________________________________   PHYSICAL EXAM:  VITAL SIGNS: ED Triage Vitals  Enc Vitals Group     BP 01/01/17 0215 107/76     Pulse Rate 01/01/17 0221 75     Resp 01/01/17 0221 13  Temp 01/01/17 0215 (!) 97.3 F (36.3 C)     Temp Source 01/01/17 0215 Oral     SpO2 01/01/17 0221 100 %     Weight 01/01/17 0216 63.5 kg (140 lb)     Height 01/01/17 0216 1.727 m (5\' 8" )     Head Circumference --      Peak Flow --      Pain Score --      Pain Loc --      Pain Edu? --      Excl. in GC? --     Constitutional: Alert and oriented.  Appears uncomfortable but nontoxic Eyes: Conjunctivae are normal.  Head: Atraumatic. Nose: No  congestion/rhinnorhea. Mouth/Throat: Mucous membranes are moist. Neck: No stridor.  No meningeal signs.   Cardiovascular: Normal rate, regular rhythm. Good peripheral circulation. Grossly normal heart sounds. Respiratory: Normal respiratory effort.  No retractions. Lungs CTAB. Gastrointestinal: Soft and nontender. No distention.  Musculoskeletal: No lower extremity tenderness nor edema. No gross deformities of extremities. Neurologic:  Normal speech and language. No gross focal neurologic deficits are appreciated.  Skin:  Skin is warm, dry and intact. No rash noted. Psychiatric: Mood and affect are normal. Speech and behavior are normal.  Sleepy after Benadryl but otherwise appropriate  ____________________________________________   LABS (all labs ordered are listed, but only abnormal results are displayed)  Labs Reviewed  CBC WITH DIFFERENTIAL/PLATELET - Abnormal; Notable for the following components:      Result Value   WBC 18.5 (*)    Neutro Abs 15.8 (*)    All other components within normal limits  COMPREHENSIVE METABOLIC PANEL - Abnormal; Notable for the following components:   Glucose, Bld 128 (*)    ALT 11 (*)    Alkaline Phosphatase 36 (*)    All other components within normal limits  LIPASE, BLOOD   ____________________________________________  EKG  ED ECG REPORT I, Loleta Roseory Tedi Hughson, the attending physician, personally viewed and interpreted this ECG.  Date: 01/01/2017 EKG Time: 02:18 Rate: 79 Rhythm: normal sinus rhythm QRS Axis: normal Intervals: short PR interval of 106 ms w/ LAFB ST/T Wave abnormalities: normal Narrative Interpretation: no evidence of acute ischemia  ____________________________________________  RADIOLOGY   No results found.  ____________________________________________   PROCEDURES  Critical Care performed: Yes, see critical care procedure note(s)   Procedure(s) performed:   .Critical Care Performed by: Loleta RoseForbach, Caesar Mannella,  MD Authorized by: Loleta RoseForbach, Verdie Wilms, MD   Critical care provider statement:    Critical care time (minutes):  45   Critical care time was exclusive of:  Separately billable procedures and treating other patients   Critical care was necessary to treat or prevent imminent or life-threatening deterioration of the following conditions: anaphylaxis.   Critical care was time spent personally by me on the following activities:  Development of treatment plan with patient or surrogate, discussions with consultants, evaluation of patient's response to treatment, examination of patient, obtaining history from patient or surrogate, ordering and performing treatments and interventions, ordering and review of laboratory studies, ordering and review of radiographic studies, pulse oximetry, re-evaluation of patient's condition and review of old charts     ____________________________________________   INITIAL IMPRESSION / ASSESSMENT AND PLAN / ED COURSE  As part of my medical decision making, I reviewed the following data within the electronic MEDICAL RECORD NUMBER History obtained from family, Nursing notes reviewed and incorporated, Labs reviewed , EKG interpreted  and Patient signed out to Dr. Mayford Knifewilliams.    Differential diagnosis includes viral  gastroenteritis, food poisoning, anaphylaxis, bacterial infection, etc.  She is having no OB related complaints at this time.  Given the presence of hives and acute and severe GI symptoms, I believe that this represents anaphylaxis.  I explained all this to the patient and recommended that we treat her with Solu-Medrol and epinephrine to help her symptoms immediately and over time, but she is very reluctant to take any medications due to her pregnancy.  I explained that I believe there is safe and in her best interest but she wants to hold off; she states that she is already feeling better and the other 2 times that she had similar symptoms she felt better after Benadryl and IV  fluids.  I agreed to hold off for now but if she continues to have persistent GI symptoms I think she would benefit from treatment.  I will check basic labs to verify that her electrolytes are okay and I am giving a liter of normal saline.  Clinical Course as of Jan 02 748  Fri Jan 01, 2017  9147 Labs unremarkable except for leukocytosis which could be due to her symptoms, viral gastroenteritis, anaphylaxis, or acute infection. WBC: (!) 18.5 [CF]  0409 Patient is feeling better, has had one more diarrhea but states that "everything is settling down".  BP is about 98 systolic, but she says this is normal.  Discussed anaphylaxis with her and her husband again, and they still want to hold off on steroids and epi, but she will let me know if her symptoms worsen.  [CF]  [phone] Patient getting worse again, another episode of diarrhea, abdominal cramping, feels short of breath.  Agrees to anaphylaxis treatment.  Will continue to monitor.  [CF]  [id number] Transferring ED care to Dr. Mayford Knife to reassess and determine disposition based on response to treatment.  [CF]    Clinical Course User Index [CF] Loleta Rose, MD    ____________________________________________  FINAL CLINICAL IMPRESSION(S) / ED DIAGNOSES  Final diagnoses:  Anaphylaxis, initial encounter  Nausea vomiting and diarrhea     MEDICATIONS GIVEN DURING THIS VISIT:  Medications  sodium chloride 0.9 % bolus 1,000 mL (0 mLs Intravenous Stopped 01/01/17 0450)  ondansetron (ZOFRAN) injection 4 mg (4 mg Intravenous Given 01/01/17 0707)  EPINEPHrine (EPI-PEN) injection 0.3 mg (0.3 mg Intramuscular Given 01/01/17 0711)  methylPREDNISolone sodium succinate (SOLU-MEDROL) 125 mg/2 mL injection 125 mg (125 mg Intravenous Given 01/01/17 0707)  sodium chloride 0.9 % bolus 1,000 mL (1,000 mLs Intravenous New Bag/Given 01/01/17 0707)     ED Discharge Orders        Ordered    EPINEPHrine (EPIPEN 2-PAK) 0.3 mg/0.3 mL IJ SOAJ injection   Once      01/01/17 0748    predniSONE (DELTASONE) 20 MG tablet  Daily     01/01/17 0748       Note:  This document was prepared using Dragon voice recognition software and may include unintentional dictation errors.    Loleta Rose, MD 01/01/17 [phone]

## 2017-01-01 NOTE — ED Notes (Signed)
Patient is resting comfortably at this time with no signs of distress present. VS stable. Will continue to monitor.   

## 2017-01-01 NOTE — ED Triage Notes (Signed)
Pt presents to ED 16 c/o an allergic reaction to an unknown substance; per EMS, when they arrived on the scene, pt was on the toilet, with nausea and vomiting, and had hives on the legs, and arms; EMS gave 50mg  benadryl; pt also had an episode of diarrhea on the way to the hospital; pt states nausea, vomiting and diarrhea started about half an hour prior to EMS arrival; pt states vomiting around 6-7 times, and 2-3 episodes of diarrhea. Pt is also [redacted] weeks pregnant. Pt is awake, alert and oriented x4.

## 2017-01-01 NOTE — ED Provider Notes (Signed)
Patient seems improved, should be discharged with Zofran and steroids and is stable for outpatient follow-up with her doctor.  It is difficult to tell if this is viral or what the exact etiology is.   Emily FilbertWilliams, Deren Degrazia E, MD 01/01/17 1024

## 2017-01-05 ENCOUNTER — Ambulatory Visit (INDEPENDENT_AMBULATORY_CARE_PROVIDER_SITE_OTHER): Payer: Medicaid Other | Admitting: Maternal Newborn

## 2017-01-05 ENCOUNTER — Encounter: Payer: Self-pay | Admitting: Maternal Newborn

## 2017-01-05 DIAGNOSIS — Z3A11 11 weeks gestation of pregnancy: Secondary | ICD-10-CM

## 2017-01-05 DIAGNOSIS — O09291 Supervision of pregnancy with other poor reproductive or obstetric history, first trimester: Secondary | ICD-10-CM

## 2017-01-05 DIAGNOSIS — Z3480 Encounter for supervision of other normal pregnancy, unspecified trimester: Secondary | ICD-10-CM | POA: Insufficient documentation

## 2017-01-05 NOTE — Patient Instructions (Signed)
First Trimester of Pregnancy The first trimester of pregnancy is from week 1 until the end of week 13 (months 1 through 3). A week after a sperm fertilizes an egg, the egg will implant on the wall of the uterus. This embryo will begin to develop into a baby. Genes from you and your partner will form the baby. The female genes will determine whether the baby will be a boy or a girl. At 6-8 weeks, the eyes and face will be formed, and the heartbeat can be seen on ultrasound. At the end of 12 weeks, all the baby's organs will be formed. Now that you are pregnant, you will want to do everything you can to have a healthy baby. Two of the most important things are to get good prenatal care and to follow your health care provider's instructions. Prenatal care is all the medical care you receive before the baby's birth. This care will help prevent, find, and treat any problems during the pregnancy and childbirth. Body changes during your first trimester Your body goes through many changes during pregnancy. The changes vary from woman to woman.  You may gain or lose a couple of pounds at first.  You may feel sick to your stomach (nauseous) and you may throw up (vomit). If the vomiting is uncontrollable, call your health care provider.  You may tire easily.  You may develop headaches that can be relieved by medicines. All medicines should be approved by your health care provider.  You may urinate more often. Painful urination may mean you have a bladder infection.  You may develop heartburn as a result of your pregnancy.  You may develop constipation because certain hormones are causing the muscles that push stool through your intestines to slow down.  You may develop hemorrhoids or swollen veins (varicose veins).  Your breasts may begin to grow larger and become tender. Your nipples may stick out more, and the tissue that surrounds them (areola) may become darker.  Your gums may bleed and may be  sensitive to brushing and flossing.  Dark spots or blotches (chloasma, mask of pregnancy) may develop on your face. This will likely fade after the baby is born.  Your menstrual periods will stop.  You may have a loss of appetite.  You may develop cravings for certain kinds of food.  You may have changes in your emotions from day to day, such as being excited to be pregnant or being concerned that something may go wrong with the pregnancy and baby.  You may have more vivid and strange dreams.  You may have changes in your hair. These can include thickening of your hair, rapid growth, and changes in texture. Some women also have hair loss during or after pregnancy, or hair that feels dry or thin. Your hair will most likely return to normal after your baby is born.  What to expect at prenatal visits During a routine prenatal visit:  You will be weighed to make sure you and the baby are growing normally.  Your blood pressure will be taken.  Your abdomen will be measured to track your baby's growth.  The fetal heartbeat will be listened to between weeks 10 and 14 of your pregnancy.  Test results from any previous visits will be discussed.  Your health care provider may ask you:  How you are feeling.  If you are feeling the baby move.  If you have had any abnormal symptoms, such as leaking fluid, bleeding, severe headaches,   or abdominal cramping.  If you are using any tobacco products, including cigarettes, chewing tobacco, and electronic cigarettes.  If you have any questions.  Other tests that may be performed during your first trimester include:  Blood tests to find your blood type and to check for the presence of any previous infections. The tests will also be used to check for low iron levels (anemia) and protein on red blood cells (Rh antibodies). Depending on your risk factors, or if you previously had diabetes during pregnancy, you may have tests to check for high blood  sugar that affects pregnant women (gestational diabetes).  Urine tests to check for infections, diabetes, or protein in the urine.  An ultrasound to confirm the proper growth and development of the baby.  Fetal screens for spinal cord problems (spina bifida) and Down syndrome.  HIV (human immunodeficiency virus) testing. Routine prenatal testing includes screening for HIV, unless you choose not to have this test.  You may need other tests to make sure you and the baby are doing well.  Follow these instructions at home: Medicines  Follow your health care provider's instructions regarding medicine use. Specific medicines may be either safe or unsafe to take during pregnancy.  Take a prenatal vitamin that contains at least 600 micrograms (mcg) of folic acid.  If you develop constipation, try taking a stool softener if your health care provider approves. Eating and drinking  Eat a balanced diet that includes fresh fruits and vegetables, whole grains, good sources of protein such as meat, eggs, or tofu, and low-fat dairy. Your health care provider will help you determine the amount of weight gain that is right for you.  Avoid raw meat and uncooked cheese. These carry germs that can cause birth defects in the baby.  Eating four or five small meals rather than three large meals a day may help relieve nausea and vomiting. If you start to feel nauseous, eating a few soda crackers can be helpful. Drinking liquids between meals, instead of during meals, also seems to help ease nausea and vomiting.  Limit foods that are high in fat and processed sugars, such as fried and sweet foods.  To prevent constipation: ? Eat foods that are high in fiber, such as fresh fruits and vegetables, whole grains, and beans. ? Drink enough fluid to keep your urine clear or pale yellow. Activity  Exercise only as directed by your health care provider. Most women can continue their usual exercise routine during  pregnancy. Try to exercise for 30 minutes at least 5 days a week. Exercising will help you: ? Control your weight. ? Stay in shape. ? Be prepared for labor and delivery.  Experiencing pain or cramping in the lower abdomen or lower back is a good sign that you should stop exercising. Check with your health care provider before continuing with normal exercises.  Try to avoid standing for long periods of time. Move your legs often if you must stand in one place for a long time.  Avoid heavy lifting.  Wear low-heeled shoes and practice good posture.  You may continue to have sex unless your health care provider tells you not to. Relieving pain and discomfort  Wear a good support bra to relieve breast tenderness.  Take warm sitz baths to soothe any pain or discomfort caused by hemorrhoids. Use hemorrhoid cream if your health care provider approves.  Rest with your legs elevated if you have leg cramps or low back pain.  If you develop   varicose veins in your legs, wear support hose. Elevate your feet for 15 minutes, 3-4 times a day. Limit salt in your diet. Prenatal care  Schedule your prenatal visits by the twelfth week of pregnancy. They are usually scheduled monthly at first, then more often in the last 2 months before delivery.  Write down your questions. Take them to your prenatal visits.  Keep all your prenatal visits as told by your health care provider. This is important. Safety  Wear your seat belt at all times when driving.  Make a list of emergency phone numbers, including numbers for family, friends, the hospital, and police and fire departments. General instructions  Ask your health care provider for a referral to a local prenatal education class. Begin classes no later than the beginning of month 6 of your pregnancy.  Ask for help if you have counseling or nutritional needs during pregnancy. Your health care provider can offer advice or refer you to specialists for help  with various needs.  Do not use hot tubs, steam rooms, or saunas.  Do not douche or use tampons or scented sanitary pads.  Do not cross your legs for long periods of time.  Avoid cat litter boxes and soil used by cats. These carry germs that can cause birth defects in the baby and possibly loss of the fetus by miscarriage or stillbirth.  Avoid all smoking, herbs, alcohol, and medicines not prescribed by your health care provider. Chemicals in these products affect the formation and growth of the baby.  Do not use any products that contain nicotine or tobacco, such as cigarettes and e-cigarettes. If you need help quitting, ask your health care provider. You may receive counseling support and other resources to help you quit.  Schedule a dentist appointment. At home, brush your teeth with a soft toothbrush and be gentle when you floss. Contact a health care provider if:  You have dizziness.  You have mild pelvic cramps, pelvic pressure, or nagging pain in the abdominal area.  You have persistent nausea, vomiting, or diarrhea.  You have a bad smelling vaginal discharge.  You have pain when you urinate.  You notice increased swelling in your face, hands, legs, or ankles.  You are exposed to fifth disease or chickenpox.  You are exposed to German measles (rubella) and have never had it. Get help right away if:  You have a fever.  You are leaking fluid from your vagina.  You have spotting or bleeding from your vagina.  You have severe abdominal cramping or pain.  You have rapid weight gain or loss.  You vomit blood or material that looks like coffee grounds.  You develop a severe headache.  You have shortness of breath.  You have any kind of trauma, such as from a fall or a car accident. Summary  The first trimester of pregnancy is from week 1 until the end of week 13 (months 1 through 3).  Your body goes through many changes during pregnancy. The changes vary from  woman to woman.  You will have routine prenatal visits. During those visits, your health care provider will examine you, discuss any test results you may have, and talk with you about how you are feeling. This information is not intended to replace advice given to you by your health care provider. Make sure you discuss any questions you have with your health care provider. Document Released: 01/20/2001 Document Revised: 01/08/2016 Document Reviewed: 01/08/2016 Elsevier Interactive Patient Education  2017 Elsevier   Inc.  

## 2017-01-05 NOTE — Progress Notes (Signed)
01/05/2017   Chief Complaint: Amenorrhea, positive home pregnancy test, desires prenatal care.  Transfer of Care Patient: no  History of Present Illness: [name] is a 26 y.o. [id number] 9229w4d based on Patient's last menstrual period on 10/16/2016 (exact date), with an Estimated Date of Delivery: 07/23/17, with the above CC.   Her periods were: regular periods every 30 days She was using LAM when she conceived.  She has Negative signs or symptoms of nausea/vomiting of pregnancy. She has Negative signs or symptoms of miscarriage or preterm labor She identifies Negative Zika risk factors for her and her partner On any different medications around the time she conceived/early pregnancy: No  History of varicella: Yes   She had an episode of nausea, vomiting and diarrhea with loss of consciousness and hives on 01/01/2017 that required a visit to the ED. Her symptoms resolved on that day and have not returned. She reports two similar episodes in earlier pregnancies (one before she knew she was pregnant and one at [redacted] weeks gestation). This is different than the usual reaction she gets when exposed to fish products (her only allergy). She was not exposed to any unusual substances that she is aware of that may have provoked these reactions.  ROS: A 12-point review of systems was performed and negative, except as stated in the above HPI.  OBGYN History: As per HPI. OB History  Gravida Para Term Preterm AB Living  4 2 2  0 0 2  SAB TAB Ectopic Multiple Live Births  0 0 0 0 2    # Outcome Date GA Lbr Len/2nd Weight Sex Delivery Anes PTL Lv  4 Current           3 Term 11/29/15 3238w0d  8 lb 7 oz (3.827 kg) M Vag-Spont   LIV  2 Term 08/20/12 5259w0d  8 lb 8 oz (3.856 kg) F Vag-Spont EPI N LIV  1 Gravida               Any issues with any prior pregnancies: yes, postdates for G2 and G3 and vaginal bleeding at onset of labor in G3. Any prior children are healthy, doing well, without any problems or  issues: yes History of pap smears: Yes. Last pap smear 04/11/2015. Abnormal: no (NIL) History of STIs: No   Past Medical History: Past Medical History:  Diagnosis Date  . History of Papanicolaou smear of cervix 01/14/12; 04/11/15   NEG; NEG, CT/GC/TR NEG;  . Medical history non-contributory     Past Surgical History: Past Surgical History:  Procedure Laterality Date  . NO PAST SURGERIES      Family History:  Family History  Problem Relation Age of Onset  . Breast cancer Maternal Grandmother [id number]  . Heart disease Paternal Grandfather    She denies any female cancers, bleeding or blood clotting disorders.   She denies any history of intellectual disability, birth defects or genetic disorders in her history. The FOB has a nephew with autism and a congenital heart problem.  Social History:  Social History   Socioeconomic History  . Marital status: Married    Spouse name: Not on file  . Number of children: 2  . Years of education: 2712  . Highest education level: Not on file  Social Needs  . Financial resource strain: Not on file  . Food insecurity - worry: Not on file  . Food insecurity - inability: Not on file  . Transportation needs - medical: Not on file  . Transportation  needs - non-medical: Not on file  Occupational History  . 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   Tobacco Use  . Smoking status: Never Smoker  . Smokeless tobacco: Never Used  Substance and Sexual Activity  . Alcohol use: No  . Drug use: No  . Sexual activity: Yes  Other Topics Concern  . Not on file  Social History Narrative  . Not on file   Any cats in the household: no Denies history of and current domestic violence.  Allergy: Allergies  Allergen Reactions  . Fish Allergy Anaphylaxis  . Fish-Derived Products Anaphylaxis    Current Outpatient Medications:  Current Outpatient Medications:  .  EPINEPHrine (EPIPEN 2-PAK) 0.3 mg/0.3 mL IJ  SOAJ injection, 1 IM INJECTION AS NEEDED, Disp: , Rfl:  .  Prenatal Vit-Fe Fumarate-FA (MULTIVITAMIN-PRENATAL) 27-0.8 MG TABS tablet, Take 1 tablet by mouth daily at 12 noon., Disp: , Rfl:    Physical Exam:   BP 100/60   Wt 143 lb (64.9 kg)   LMP 10/16/2016 (Exact Date)   BMI 21.74 kg/m  Body mass index is 21.74 kg/m. Constitutional: Well nourished, well developed female in no acute distress.  Neck:  Supple, normal appearance, and no thyromegaly  Cardiovascular: S1, S2 normal, no murmur, rub or gallop, regular rate and rhythm Respiratory:  Clear to auscultation bilaterally. Normal respiratory effort Abdomen: positive bowel sounds and no masses, hernias; diffusely non tender to palpation, non distended Breasts: breasts appear normal, no suspicious masses, no skin or nipple changes or axillary nodes. Neuro/Psych:  Normal mood and affect.  Skin:  Warm and dry.  Lymphatic:  No inguinal lymphadenopathy.   Pelvic exam: is not limited by body habitus External genitalia, Bartholin's glands, Urethra, Skene's glands: within normal limits Vagina: within normal limits and with no blood in the vault  Cervix: normal appearing cervix without discharge or lesions, closed/long/high Uterus:  enlarged, c/w 12 week size Adnexa:  normal adnexa and no mass, fullness, tenderness  Assessment: [name] is a 26 y.o. [id number] 3578w4d based on Patient's last menstrual period on 10/16/2016 (exact date), with an Estimated Date of Delivery: 07/23/17, presenting for prenatal care.  Plan:  1) Avoid alcoholic beverages. 2) Patient encouraged not to smoke.  3) Discontinue the use of all non-medicinal drugs and chemicals.  4) Take prenatal vitamins daily.  5) Seatbelt use advised 6) Nutrition, food safety (fish, cheese advisories, and high nitrite foods) and exercise discussed. 7) [facility] discussed  8) Patient is asked about travel to areas at risk for the Zika virus, and  counseled to avoid travel and exposure to mosquitoes or sexual partners who may have themselves been exposed to the virus. Testing is discussed, and will be ordered as appropriate.  9) [facility]birth [facility] at [facility] advised 10) Genetic Screening, such as with 1st Trimester Screening, cell free fetal DNA, AFP testing, and Ultrasound, as well as with amniocentesis and CVS as appropriate, is discussed with patient. She plans to have genetic testing this pregnancy (First trimester screen next visit).  Problem list reviewed and updated.  Return in about 1 week (around 01/12/2017) for ROB and first trimester screen/NT scan.  Marcelyn BruinsJacelyn Jeramy Dimmick, CNM [facility], [facility] 01/05/2017  3:17 PM

## 2017-01-05 NOTE — Progress Notes (Signed)
[facility] for anaphylacis - cause unknown

## 2017-01-06 LAB — RPR+RH+ABO+RUB AB+AB SCR+CB...
Antibody Screen: NEGATIVE
HIV SCREEN 4TH GENERATION: NONREACTIVE
Hematocrit: 37.2 % (ref 34.0–46.6)
Hemoglobin: 12.8 g/dL (ref 11.1–15.9)
Hepatitis B Surface Ag: NEGATIVE
MCH: 30.8 pg (ref 26.6–33.0)
MCHC: 34.4 g/dL (ref 31.5–35.7)
MCV: 89 fL (ref 79–97)
Platelets: 302 10*3/uL (ref 150–379)
RBC: 4.16 x10E6/uL (ref 3.77–5.28)
RDW: 12.9 % (ref 12.3–15.4)
RPR: NONREACTIVE
RUBELLA: 1.89 {index} (ref >0.99)
Rh Factor: POSITIVE
VARICELLA: 2223 {index} (ref >165)
WBC: 9.3 10*3/uL (ref 3.4–10.8)

## 2017-01-07 LAB — GC/CHLAMYDIA PROBE AMP
Chlamydia trachomatis, NAA: NEGATIVE
Neisseria gonorrhoeae by PCR: NEGATIVE

## 2017-01-07 LAB — URINE DRUG PANEL 7
AMPHETAMINES, URINE: NEGATIVE ng/mL
BARBITURATE QUANT UR: NEGATIVE ng/mL
Benzodiazepine Quant, Ur: NEGATIVE ng/mL
CANNABINOID QUANT UR: NEGATIVE ng/mL
COCAINE (METAB.): NEGATIVE ng/mL
Opiate Quant, Ur: NEGATIVE ng/mL
PCP Quant, Ur: NEGATIVE ng/mL

## 2017-01-07 LAB — URINE CULTURE

## 2017-01-11 ENCOUNTER — Encounter: Payer: Self-pay | Admitting: Certified Nurse Midwife

## 2017-01-13 ENCOUNTER — Ambulatory Visit (INDEPENDENT_AMBULATORY_CARE_PROVIDER_SITE_OTHER): Payer: Medicaid Other | Admitting: Maternal Newborn

## 2017-01-13 ENCOUNTER — Ambulatory Visit (INDEPENDENT_AMBULATORY_CARE_PROVIDER_SITE_OTHER): Payer: Medicaid Other

## 2017-01-13 ENCOUNTER — Encounter: Payer: Self-pay | Admitting: Maternal Newborn

## 2017-01-13 VITALS — BP 90/60 | Wt 142.0 lb

## 2017-01-13 DIAGNOSIS — Z1379 Encounter for other screening for genetic and chromosomal anomalies: Secondary | ICD-10-CM

## 2017-01-13 DIAGNOSIS — Z362 Encounter for other antenatal screening follow-up: Secondary | ICD-10-CM

## 2017-01-13 DIAGNOSIS — Z3480 Encounter for supervision of other normal pregnancy, unspecified trimester: Secondary | ICD-10-CM | POA: Diagnosis not present

## 2017-01-13 DIAGNOSIS — Z889 Allergy status to unspecified drugs, medicaments and biological substances status: Secondary | ICD-10-CM | POA: Insufficient documentation

## 2017-01-13 NOTE — Progress Notes (Signed)
    Routine Prenatal Care Visit  Subjective  Cristina Le is a 26 y.o. G4P2002 at 5316w5d being seen today for ongoing prenatal care.  She is currently monitored for the following issues for this low-risk pregnancy and has Supervision of other normal pregnancy, antepartum and Allergic reaction, history of on her problem list.  ----------------------------------------------------------------------------------- Patient reports no complaints.   Vag. Bleeding: None.    ----------------------------------------------------------------------------------- The following portions of the patient's history were reviewed and updated as appropriate: allergies, current medications, past family history, past medical history, past social history, past surgical history and problem list. Problem list updated.   Objective  Blood pressure 90/60, weight 142 lb (64.4 kg), last menstrual period 10/16/2016, unknown if currently breastfeeding. Pregravid weight 140 lb (63.5 kg) Total Weight Gain 2 lb (0.907 kg) Urinalysis: Urine Protein: Negative Urine Glucose: Negative  Fetal Status: Fetal Heart Rate (bpm): 159         General:  Alert, oriented and cooperative. Patient is in no acute distress.  Skin: Skin is warm and dry. No rash noted.   Cardiovascular: Normal heart rate noted  Respiratory: Normal respiratory effort, no problems with respiration noted  Abdomen: Soft, gravid, appropriate for gestational age. Pain/Pressure: Absent     Pelvic:  Cervical exam deferred        Extremities: Normal range of motion.  Edema: None  Mental Status: Normal mood and affect. Normal behavior. Normal judgment and thought content.     Assessment   26 y.o. [id number] at 4516w5d, EDD 07/23/2017, by Last Menstrual Period presenting for routine prenatal visit.  Plan   THIRD  Problems (from 01/05/17 to present)    Problem Noted Resolved   Supervision of other normal pregnancy, antepartum 01/05/2017 by Oswaldo ConroySchmid, Zakariah Dejarnette Y, CNM No   Overview Signed 01/13/2017  4:08 PM by Oswaldo ConroySchmid, Shaiann Mcmanamon Y, CNM    Clinic [facility] Prenatal Labs  Dating LMP=12w 5d Ultrasound Blood type: A/Positive/-- (11/27 1538)   Genetic Screen 1 Screen:      Antibody:Negative (11/27 1538)  Anatomic US  Rubella: 1.89 (11/27 1538) Varicella: Immune  GTT Early:               Third trimester:  RPR: Non Reactive (11/27 1538)   Rhogam  HBsAg: Negative (11/27 1538)   TDaP vaccine                       Flu Shot: HIV:   Non Reactive  Baby Food                                GBS:   Contraception  Pap:  CBB     CS/VBAC    Support Person               First trimester screen today, NT seen, GA=dates.    Referral to allergist for unexplained allergic reaction requiring ED visit on 01/01/2017.  Preterm labor symptoms and general obstetric precautions including but not limited to vaginal bleeding, contractions, leaking of fluid and fetal movement were reviewed in detail with the patient.  Return in about 4 weeks (around 02/10/2017) for ROB.  Marcelyn BruinsJacelyn Hetvi Shawhan, CNM 01/13/2017  4:27 PM

## 2017-01-16 LAB — FIRST TRIMESTER SCREEN W/NT
CRL: 60.8 mm
DIA MoM: 0.73
DIA Value: 181.3 pg/mL
Gest Age-Collect: 12.4 weeks
Maternal Age At EDD: 27.7 yr
Nuchal Translucency MoM: 0.9
Nuchal Translucency: 1.4 mm
Number of Fetuses: 1
PAPP-A MoM: 1.47
PAPP-A Value: 1526.6 ng/mL
Test Results:: NEGATIVE
Weight: 142 [lb_av]
hCG MoM: 0.82
hCG Value: 75.3 IU/mL

## 2017-02-04 ENCOUNTER — Encounter: Payer: Self-pay | Admitting: Allergy and Immunology

## 2017-02-09 NOTE — L&D Delivery Note (Signed)
Delivery Note Primary OB: [facility] Delivery Physician: Cristina Harris, MD Gestational Age: Full term Antepartum complications: nAnnamarie Le Intrapartum complications: [name]  A viable Female was delivered via vertex perentation.  Apgars:9 ,9  Weight:  pending .   Placenta status: spontaneous and Intact.  Cord: 3+ vessels;  with the following complications: nuchal. Right hand deformity, small fingers thumb and wrist.    Anesthesia:  epidural Episiotomy:  none Lacerations:  none Suture Repair: none Est. Blood Loss (mL):  less than 100 mL  Mom to postpartum.  Baby to Couplet care / Skin to Skin.  Cristina MajorPaul Harris, MD, Cristina FrederickFACOG [facility], [facility] 07/27/2017  10:57 PM [phone]

## 2017-02-11 ENCOUNTER — Ambulatory Visit (INDEPENDENT_AMBULATORY_CARE_PROVIDER_SITE_OTHER): Payer: Medicaid Other | Admitting: Obstetrics and Gynecology

## 2017-02-11 ENCOUNTER — Encounter: Payer: Self-pay | Admitting: Obstetrics and Gynecology

## 2017-02-11 VITALS — BP 108/68 | Wt 148.0 lb

## 2017-02-11 DIAGNOSIS — Z3480 Encounter for supervision of other normal pregnancy, unspecified trimester: Secondary | ICD-10-CM

## 2017-02-11 DIAGNOSIS — Z3A16 16 weeks gestation of pregnancy: Secondary | ICD-10-CM

## 2017-02-11 NOTE — Addendum Note (Signed)
Addended by: Thomasene MohairJACKSON, STEPHEN D on: 02/11/2017 02:04 PM   Modules accepted: Orders

## 2017-02-11 NOTE — Progress Notes (Signed)
  Routine Prenatal Care Visit  Subjective  Cristina Le is a 27 y.o. G4P2002 at 3981w6d being seen today for ongoing prenatal care.  She is currently monitored for the following issues for this low-risk pregnancy and has Supervision of other normal pregnancy, antepartum and Allergic reaction, history of on their problem list.  ----------------------------------------------------------------------------------- Patient reports no complaints.    . Vag. Bleeding: None.  Movement: Present. Denies leaking of fluid.  ----------------------------------------------------------------------------------- The following portions of the patient's history were reviewed and updated as appropriate: allergies, current medications, past family history, past medical history, past social history, past surgical history and problem list. Problem list updated.   Objective  Blood pressure 108/68, weight 148 lb (67.1 kg), last menstrual period 10/16/2016, unknown if currently breastfeeding. Pregravid weight 140 lb (63.5 kg) Total Weight Gain 8 lb (3.629 kg) Urinalysis: Urine Protein: Negative Urine Glucose: Negative  Fetal Status: Fetal Heart Rate (bpm): 145   Movement: Present     General:  Alert, oriented and cooperative. Patient is in no acute distress.  Skin: Skin is warm and dry. No rash noted.   Cardiovascular: Normal heart rate noted  Respiratory: Normal respiratory effort, no problems with respiration noted  Abdomen: Soft, gravid, appropriate for gestational age. Pain/Pressure: Absent     Pelvic:  Cervical exam deferred        Extremities: Normal range of motion.     Mental Status: Normal mood and affect. Normal behavior. Normal judgment and thought content.   Assessment   27 y.o. [id number] at 9181w6d by  07/23/2017, by Last Menstrual Period presenting for routine prenatal visit  Plan   THIRD  Problems (from 01/05/17 to present)    Problem Noted Resolved   Supervision of other normal pregnancy,  antepartum 01/05/2017 by Oswaldo ConroySchmid, Jacelyn Y, CNM No   Overview Addendum 01/19/2017 11:38 AM by Oswaldo ConroySchmid, Jacelyn Y, CNM    Clinic [facility] Prenatal Labs  Dating LMP=12w 5d Ultrasound Blood type: A/Positive/-- (11/27 1538)   Genetic Screen 1 Screen: Negative   Antibody:Negative (11/27 1538)  Anatomic US  Rubella: 1.89 (11/27 1538) Varicella: Immune  GTT Early:               Third trimester:  RPR: Non Reactive (11/27 1538)   Rhogam  HBsAg: Negative (11/27 1538)   TDaP vaccine                       Flu Shot: HIV:   Non Reactive  Baby Food                                GBS:   Contraception  Pap:  CBB     CS/VBAC    Support Person                 Please refer to After Visit Summary for other counseling recommendations.   Return in about 3 weeks (around 03/04/2017) for schedule u/s for anatomy screen and routine prenatal.  -msAFP today  Thomasene MohairStephen Triston Lisanti, MD  02/11/2017 1:55 PM

## 2017-02-14 LAB — AFP, SERUM, OPEN SPINA BIFIDA
AFP MOM: 1.09
AFP Value: 39.9 ng/mL
Gest. Age on Collection Date: 16.9 weeks
MATERNAL AGE AT EDD: 27.7 a
OSBR Risk 1 IN: 9231
Test Results:: NEGATIVE
Weight: 148 [lb_av]

## 2017-03-05 ENCOUNTER — Ambulatory Visit (INDEPENDENT_AMBULATORY_CARE_PROVIDER_SITE_OTHER): Payer: Medicaid Other | Admitting: Obstetrics & Gynecology

## 2017-03-05 ENCOUNTER — Ambulatory Visit (INDEPENDENT_AMBULATORY_CARE_PROVIDER_SITE_OTHER): Payer: Medicaid Other

## 2017-03-05 VITALS — BP 90/60 | Wt 149.0 lb

## 2017-03-05 DIAGNOSIS — Z362 Encounter for other antenatal screening follow-up: Secondary | ICD-10-CM | POA: Diagnosis not present

## 2017-03-05 DIAGNOSIS — Z3480 Encounter for supervision of other normal pregnancy, unspecified trimester: Secondary | ICD-10-CM

## 2017-03-05 DIAGNOSIS — Z3A20 20 weeks gestation of pregnancy: Secondary | ICD-10-CM

## 2017-03-05 NOTE — Patient Instructions (Signed)

## 2017-03-05 NOTE — Progress Notes (Signed)
  HPI: No pain, min nausea  Ultrasound demonstrates normal anat screen These findings are normal  PMHx: She  has a past medical history of History of Papanicolaou smear of cervix (01/14/12; 04/11/15) and Medical history non-contributory. Also,  has a past surgical history that includes No past surgeries., family history includes Breast cancer (age of onset: 8187) in her maternal grandmother; Heart disease in her paternal grandfather.,  reports that  has never smoked. she has never used smokeless tobacco. She reports that she does not drink alcohol or use drugs.  She has a current medication list which includes the following prescription(s): epinephrine and multivitamin-prenatal. Also, is allergic to fish allergy and fish-derived products.  ROS  Objective: BP 90/60   Wt 149 lb (67.6 kg)   LMP 10/16/2016 (Exact Date)   BMI 22.66 kg/m   Physical examination Constitutional NAD, Conversant  Skin No rashes, lesions or ulceration.   Extremities: Moves all appropriately.  Normal ROM for age. No lymphadenopathy.  Neuro: Grossly intact  Psych: Oriented to PPT.  Normal mood. Normal affect.   Assessment:  [redacted] weeks gestation of pregnancy  Supervision of other normal pregnancy, antepartum  Annamarie MajorPaul Airyonna Franklyn, MD, Merlinda FrederickFACOG [facility], [facility] 03/05/2017  2:54 PM

## 2017-04-01 ENCOUNTER — Encounter: Payer: Self-pay | Admitting: Maternal Newborn

## 2017-04-01 ENCOUNTER — Ambulatory Visit (INDEPENDENT_AMBULATORY_CARE_PROVIDER_SITE_OTHER): Payer: Medicaid Other | Admitting: Maternal Newborn

## 2017-04-01 VITALS — BP 80/50 | Wt 157.0 lb

## 2017-04-01 DIAGNOSIS — Z3480 Encounter for supervision of other normal pregnancy, unspecified trimester: Secondary | ICD-10-CM

## 2017-04-01 DIAGNOSIS — Z3A23 23 weeks gestation of pregnancy: Secondary | ICD-10-CM

## 2017-04-01 NOTE — Progress Notes (Signed)
Routine Prenatal Care Visit  Subjective  Cristina Le is a 27 y.o. G4P2002 at [redacted]w[redacted]d being seen today for ongoing prenatal care.  She is currently monitored for the following issues for this low-risk pregnancy and has Supervision of other normal pregnancy, antepartum and Allergic reaction, history of on their problem list.  ----------------------------------------------------------------------------------- Patient reports a lump in her left breast.  She states that she also felt a lump in the same area during her last pregnancy, but it receded postpartum and no imaging of the area was done. Denies pain and skin changes/rash. Contractions: Not present. Vag. Bleeding: None.  Movement: PresentDenies leaking of fluid.  ----------------------------------------------------------------------------------- The following portions of the patient's history were reviewed and updated as appropriate: allergies, current medications, past family history, past medical history, past social history, past surgical history and problem list. Problem list updated.   Objective  Blood pressure (!) 80/50, weight 157 lb (71.2 kg), last menstrual period 10/16/2016, unknown if currently breastfeeding. Pregravid weight 140 lb (63.5 kg) Total Weight Gain 17 lb (7.711 kg) Urinalysis: Urine Protein: Negative Urine Glucose: Negative  Fetal Status: Fetal Heart Rate (bpm): 152 Fundal Height: 21 cm Movement: Present     General:  Alert, oriented and cooperative. Patient is in no acute distress.  Skin: Skin is warm and dry. No rash noted.   Cardiovascular: Normal heart rate noted  Respiratory: Normal respiratory effort, no problems with respiration noted  Abdomen: Soft, gravid, appropriate for gestational age. Pain/Pressure: Absent     Pelvic:  Cervical exam deferred        Extremities: Normal range of motion.  Edema: None  Mental Status: Normal mood and affect. Normal behavior. Normal judgment and thought content.    Breast exam: multiple small palpable nodules in left breast in the area between 12 and 1 o'clock, with one more prominent nodule at about 12 o'clock, which patient identifies as the area that concerns her. Exam consistent with glandular tissue/changes related to lactogenesis.  Assessment   27 y.o. [id number], EDD [redacted]w[redacted]d by  07/23/2017 by Last Menstrual Period presenting for routine prenatal visit.  Plan   THIRD  Problems (from 01/05/17 to present)    Problem Noted Resolved   Supervision of other normal pregnancy, antepartum 01/05/2017 by Oswaldo Conroy, CNM No   Overview Addendum 02/17/2017 12:41 PM by Conard Novak, MD    [facility]inic [facility] Prenatal Labs  Dating LMP=12w 5d Ultrasound Blood type: A/Positive/-- (11/27 1538)   Genetic Screen 1 Screen: Negative   msafp neg Antibody:Negative (11/27 1538)  Anatomic [place]  Rubella: 1.89 (11/27 1538) Varicella: Immune  GTT Early:               Third trimester:  RPR: Non Reactive (11/27 1538)   Rhogam  HBsAg: Negative (11/27 1538)   TDaP vaccine                       Flu Shot: HIV:   Non Reactive  Baby Food                                GBS:   Contraception  Pap:  CBB     CS/VBAC    Support Person               After discussion with patient, we agreed that she will observe the area of concern in her left breast carefully over the next  month for any changes instead of opting for immediate imaging.  She is aware that ultrasound imaging is available to further assess this breast lump and can be ordered as needed.  Preterm labor symptoms and general obstetric precautions including but not limited to vaginal bleeding, contractions, leaking of fluid and fetal movement were reviewed in detail with the patient.  Return in about 4 weeks (around 04/29/2017) for ROB with GTT/28 week labs.  Marcelyn BruinsJacelyn Schmid, CNM 04/01/2017  1:57 PM

## 2017-04-01 NOTE — Progress Notes (Signed)
C/o lump left breast.rj

## 2017-04-29 ENCOUNTER — Encounter: Payer: Self-pay | Admitting: Obstetrics and Gynecology

## 2017-04-29 ENCOUNTER — Other Ambulatory Visit: Payer: Medicaid Other

## 2017-04-29 ENCOUNTER — Encounter: Payer: Medicaid Other | Admitting: Advanced Practice Midwife

## 2017-04-29 ENCOUNTER — Ambulatory Visit (INDEPENDENT_AMBULATORY_CARE_PROVIDER_SITE_OTHER): Payer: Medicaid Other | Admitting: Obstetrics and Gynecology

## 2017-04-29 ENCOUNTER — Encounter: Payer: Medicaid Other | Admitting: Obstetrics and Gynecology

## 2017-04-29 VITALS — BP 90/60 | Wt 163.0 lb

## 2017-04-29 DIAGNOSIS — Z3A27 27 weeks gestation of pregnancy: Secondary | ICD-10-CM

## 2017-04-29 DIAGNOSIS — Z3480 Encounter for supervision of other normal pregnancy, unspecified trimester: Secondary | ICD-10-CM

## 2017-04-29 DIAGNOSIS — T782XXS Anaphylactic shock, unspecified, sequela: Secondary | ICD-10-CM

## 2017-04-29 NOTE — Progress Notes (Signed)
ROB 1HR GTT Started to go into anaphylaxis last Thursday 04/22/17 Breast lump on left side

## 2017-04-29 NOTE — Progress Notes (Signed)
  Subjective  Fetal Movement? yes Contractions? irreg BH Leaking Fluid? no Vaginal Bleeding? no PNVs? Yes Breast/wants BTL--30 day papers signed today  Pt with stable LT breast lump from 04/01/17 appt. Can do breast u/s prn.  Pt notes sx of acute onset pelvic cramping/diarrhea/vomiting with subsequent syncope. EMS states pt's BPs are low. She feels shaky and has chills, as well as itchy hives. Goes to ED and given steroids, benadryl, and epipen with sx relief. 3rd episode this pregnancy was last wk. Pt states they keep telling her it looks like anaphylactic shock. Pt has fish allergy but is avoiding fish and eating her normal foods. No hx of cardiac issues. Sx happened once in 1st and 2nd pregnancies.  Objective  BP 90/60   Wt 163 lb (73.9 kg)   LMP 10/16/2016 (Exact Date)   BMI 24.78 kg/m  General: NAD Pulmonary: no increased work of breathing Abdomen: gravid, non-tender Extremities: no edema Psychiatric: mood appropriate, affect full  Assessment  27 y.o. [id number] at 8239w6d by  07/23/2017, by Last Menstrual Period presenting for routine prenatal visit  Plan   Problem List Items Addressed This Visit      Other   Supervision of other normal pregnancy, antepartum   Anaphylactic syndrome    Other Visit Diagnoses    [redacted] weeks gestation of pregnancy    -  Primary     Labs: 28 wk labs today  RTO 2 weeks  Rodolphe Edmonston B. Urban Naval, PA-C [facility],  04/30/2017  11:44 AM

## 2017-04-29 NOTE — Patient Instructions (Signed)
I value your feedback and entrusting us with your care. If you get a [facility] patient survey, I would appreciate you taking the time to let us know about your experience today. Thank you! 

## 2017-04-30 DIAGNOSIS — T782XXA Anaphylactic shock, unspecified, initial encounter: Secondary | ICD-10-CM | POA: Insufficient documentation

## 2017-04-30 LAB — 28 WEEK RH+PANEL
BASOS ABS: 0 10*3/uL (ref 0.0–0.2)
Basos: 0 %
EOS (ABSOLUTE): 0.1 10*3/uL (ref 0.0–0.4)
Eos: 1 %
GESTATIONAL DIABETES SCREEN: 81 mg/dL (ref 65–139)
HIV Screen 4th Generation wRfx: NONREACTIVE
Hematocrit: 35.1 % (ref 34.0–46.6)
Hemoglobin: 11.6 g/dL (ref 11.1–15.9)
IMMATURE GRANULOCYTES: 0 %
Immature Grans (Abs): 0 10*3/uL (ref 0.0–0.1)
Lymphocytes Absolute: 2.2 10*3/uL (ref 0.7–3.1)
Lymphs: 25 %
MCH: 30.9 pg (ref 26.6–33.0)
MCHC: 33 g/dL (ref 31.5–35.7)
MCV: 93 fL (ref 79–97)
MONOS ABS: 0.6 10*3/uL (ref 0.1–0.9)
Monocytes: 7 %
NEUTROS PCT: 67 %
Neutrophils Absolute: 5.8 10*3/uL (ref 1.4–7.0)
Platelets: 253 10*3/uL (ref 150–379)
RBC: 3.76 x10E6/uL — ABNORMAL LOW (ref 3.77–5.28)
RDW: 13.2 % (ref 12.3–15.4)
RPR: NONREACTIVE
WBC: 8.7 10*3/uL (ref 3.4–10.8)

## 2017-05-04 ENCOUNTER — Telehealth: Payer: Self-pay | Admitting: Obstetrics and Gynecology

## 2017-05-04 NOTE — Telephone Encounter (Signed)
Pt aware of normal 28 wk labs. Discussed syncopal, poss anaphylactic episodes with Dr. Tiburcio PeaHarris. No anemia or hypoglycemia on 1 hr GTT. No further ref at this time. Suggested she keep food diary, eat protein snack and hydrate before bed in case sx caused by hypoglycemia. Can also do zyrtec or claritin daily as poss preventive. F/u prn.

## 2017-05-13 ENCOUNTER — Ambulatory Visit (INDEPENDENT_AMBULATORY_CARE_PROVIDER_SITE_OTHER): Payer: Medicaid Other | Admitting: Certified Nurse Midwife

## 2017-05-13 ENCOUNTER — Encounter: Payer: Self-pay | Admitting: Certified Nurse Midwife

## 2017-05-13 VITALS — BP 100/50 | Wt 166.0 lb

## 2017-05-13 DIAGNOSIS — Z3480 Encounter for supervision of other normal pregnancy, unspecified trimester: Secondary | ICD-10-CM

## 2017-05-13 DIAGNOSIS — Z3A29 29 weeks gestation of pregnancy: Secondary | ICD-10-CM

## 2017-05-13 NOTE — Progress Notes (Signed)
ROB

## 2017-05-13 NOTE — Progress Notes (Signed)
Le at 29wk6d: No allergic reactions since her last visit. Baby active. No VB 28 week labs WNL Breast/BTL/ Baby girl Cristina Le and TDAP in 2 weeks.

## 2017-05-27 ENCOUNTER — Ambulatory Visit (INDEPENDENT_AMBULATORY_CARE_PROVIDER_SITE_OTHER): Payer: Medicaid Other | Admitting: Advanced Practice Midwife

## 2017-05-27 ENCOUNTER — Encounter: Payer: Self-pay | Admitting: Advanced Practice Midwife

## 2017-05-27 VITALS — BP 96/54 | Wt 171.0 lb

## 2017-05-27 DIAGNOSIS — Z3A31 31 weeks gestation of pregnancy: Secondary | ICD-10-CM

## 2017-05-27 NOTE — Progress Notes (Signed)
  Routine Prenatal Care Visit  Subjective  Cristina Le is a 27 y.o. G3P2002 at 6934w6d being seen today for ongoing prenatal care.  She is currently monitored for the following issues for this low-risk pregnancy and has Supervision of other normal pregnancy, antepartum; Allergic reaction, history of; and Anaphylactic syndrome on their problem list.  ----------------------------------------------------------------------------------- Patient reports no complaints.  She has discussed, with another provider, at a previous visit the possibility of IOL at due date instead of at 41 weeks due to history of partial abruption.  Contractions: Not present. Vag. Bleeding: None.  Movement: Present. Denies leaking of fluid.  ----------------------------------------------------------------------------------- The following portions of the patient's history were reviewed and updated as appropriate: allergies, current medications, past family history, past medical history, past social history, past surgical history and problem list. Problem list updated.   Objective  Blood pressure (!) 96/54, weight 171 lb (77.6 kg), last menstrual period 10/16/2016 Pregravid weight 140 lb (63.5 kg) Total Weight Gain 31 lb (14.1 kg) Urinalysis: Urine Protein: Negative Urine Glucose: Negative  Fetal Status: Fetal Heart Rate (bpm): 140 Fundal Height: 31 cm Movement: Present     General:  Alert, oriented and cooperative. Patient is in no acute distress.  Skin: Skin is warm and dry. No rash noted.   Cardiovascular: Normal heart rate noted  Respiratory: Normal respiratory effort, no problems with respiration noted  Abdomen: Soft, gravid, appropriate for gestational age. Pain/Pressure: Present     Pelvic:  Cervical exam deferred        Extremities: Normal range of motion.  Edema: None  Mental Status: Normal mood and affect. Normal behavior. Normal judgment and thought content.   Assessment   27 y.o. G3P2002 at 5634w6d by   07/23/2017, by Last Menstrual Period presenting for routine prenatal visit  Plan   THIRD  Problems (from 01/05/17 to present)    Problem Noted Resolved   Supervision of other normal pregnancy, antepartum 01/05/2017 by Oswaldo ConroySchmid, Jacelyn Y, CNM No   Overview Addendum 05/13/2017  5:41 PM by Farrel ConnersGutierrez, Colleen, CNM    [facility]inic [facility] Prenatal Labs  Dating LMP=12w 5d Ultrasound Blood type: A/Positive/-- (11/27 1538)   Genetic Screen 1 Screen: Negative   msafp neg Antibody:Negative (11/27 1538)  Anatomic US Baby girl Rubella: 1.89 (11/27 1538) Varicella: Immune  GTT Early:               Third trimester:  RPR: Non Reactive (11/27 1538)   Rhogam  HBsAg: Negative (11/27 1538)   TDaP vaccine                       Flu Shot: HIV:   Non Reactive  Baby Food                Breast                GBS:   Contraception TL--30 days papers 04/29/17 Pap:  CBB     CS/VBAC    Support Person                  Preterm labor symptoms and general obstetric precautions including but not limited to vaginal bleeding, contractions, leaking of fluid and fetal movement were reviewed in detail with the patient. Please refer to After Visit Summary for other counseling recommendations.   Return in about 2 weeks (around 06/10/2017) for rob.  Tresea MallJane Leonie Amacher, CNM 05/27/2017 1:41 PM

## 2017-05-27 NOTE — Patient Instructions (Signed)
Third Trimester of Pregnancy The third trimester is from week 28 through week 40 (months 7 through 9). The third trimester is a time when the unborn baby (fetus) is growing rapidly. At the end of the ninth month, the fetus is about 20 inches in length and weighs 6-10 pounds. Body changes during your third trimester Your body will continue to go through many changes during pregnancy. The changes vary from woman to woman. During the third trimester:  Your weight will continue to increase. You can expect to gain 25-35 pounds (11-16 kg) by the end of the pregnancy.  You may begin to get stretch marks on your hips, abdomen, and breasts.  You may urinate more often because the fetus is moving lower into your pelvis and pressing on your bladder.  You may develop or continue to have heartburn. This is caused by increased hormones that slow down muscles in the digestive tract.  You may develop or continue to have constipation because increased hormones slow digestion and cause the muscles that push waste through your intestines to relax.  You may develop hemorrhoids. These are swollen veins (varicose veins) in the rectum that can itch or be painful.  You may develop swollen, bulging veins (varicose veins) in your legs.  You may have increased body aches in the pelvis, back, or thighs. This is due to weight gain and increased hormones that are relaxing your joints.  You may have changes in your hair. These can include thickening of your hair, rapid growth, and changes in texture. Some women also have hair loss during or after pregnancy, or hair that feels dry or thin. Your hair will most likely return to normal after your baby is born.  Your breasts will continue to grow and they will continue to become tender. A yellow fluid (colostrum) may leak from your breasts. This is the first milk you are producing for your baby.  Your belly button may stick out.  You may notice more swelling in your hands,  face, or ankles.  You may have increased tingling or numbness in your hands, arms, and legs. The skin on your belly may also feel numb.  You may feel short of breath because of your expanding uterus.  You may have more problems sleeping. This can be caused by the size of your belly, increased need to urinate, and an increase in your body's metabolism.  You may notice the fetus "dropping," or moving lower in your abdomen (lightening).  You may have increased vaginal discharge.  You may notice your joints feel loose and you may have pain around your pelvic bone.  What to expect at prenatal visits You will have prenatal exams every 2 weeks until week 36. Then you will have weekly prenatal exams. During a routine prenatal visit:  You will be weighed to make sure you and the baby are growing normally.  Your blood pressure will be taken.  Your abdomen will be measured to track your baby's growth.  The fetal heartbeat will be listened to.  Any test results from the previous visit will be discussed.  You may have a cervical check near your due date to see if your cervix has softened or thinned (effaced).  You will be tested for Group B streptococcus. This happens between 35 and 37 weeks.  Your health care provider may ask you:  What your birth plan is.  How you are feeling.  If you are feeling the baby move.  If you have had   any abnormal symptoms, such as leaking fluid, bleeding, severe headaches, or abdominal cramping.  If you are using any tobacco products, including cigarettes, chewing tobacco, and electronic cigarettes.  If you have any questions.  Other tests or screenings that may be performed during your third trimester include:  Blood tests that check for low iron levels (anemia).  Fetal testing to check the health, activity level, and growth of the fetus. Testing is done if you have certain medical conditions or if there are problems during the  pregnancy.  Nonstress test (NST). This test checks the health of your baby to make sure there are no signs of problems, such as the baby not getting enough oxygen. During this test, a belt is placed around your belly. The baby is made to move, and its heart rate is monitored during movement.  What is false labor? False labor is a condition in which you feel small, irregular tightenings of the muscles in the womb (contractions) that usually go away with rest, changing position, or drinking water. These are called Braxton Hicks contractions. Contractions may last for hours, days, or even weeks before true labor sets in. If contractions come at regular intervals, become more frequent, increase in intensity, or become painful, you should see your health care provider. What are the signs of labor?  Abdominal cramps.  Regular contractions that start at 10 minutes apart and become stronger and more frequent with time.  Contractions that start on the top of the uterus and spread down to the lower abdomen and back.  Increased pelvic pressure and dull back pain.  A watery or bloody mucus discharge that comes from the vagina.  Leaking of amniotic fluid. This is also known as your "water breaking." It could be a slow trickle or a gush. Let your health care provider know if it has a color or strange odor. If you have any of these signs, call your health care provider right away, even if it is before your due date. Follow these instructions at home: Medicines  Follow your health care provider's instructions regarding medicine use. Specific medicines may be either safe or unsafe to take during pregnancy.  Take a prenatal vitamin that contains at least 600 micrograms (mcg) of folic acid.  If you develop constipation, try taking a stool softener if your health care provider approves. Eating and drinking  Eat a balanced diet that includes fresh fruits and vegetables, whole grains, good sources of protein  such as meat, eggs, or tofu, and low-fat dairy. Your health care provider will help you determine the amount of weight gain that is right for you.  Avoid raw meat and uncooked cheese. These carry germs that can cause birth defects in the baby.  If you have low calcium intake from food, talk to your health care provider about whether you should take a daily calcium supplement.  Eat four or five small meals rather than three large meals a day.  Limit foods that are high in fat and processed sugars, such as fried and sweet foods.  To prevent constipation: ? Drink enough fluid to keep your urine clear or pale yellow. ? Eat foods that are high in fiber, such as fresh fruits and vegetables, whole grains, and beans. Activity  Exercise only as directed by your health care provider. Most women can continue their usual exercise routine during pregnancy. Try to exercise for 30 minutes at least 5 days a week. Stop exercising if you experience uterine contractions.  Avoid heavy   lifting.  Do not exercise in extreme heat or humidity, or at high altitudes.  Wear low-heel, comfortable shoes.  Practice good posture.  You may continue to have sex unless your health care provider tells you otherwise. Relieving pain and discomfort  Take frequent breaks and rest with your legs elevated if you have leg cramps or low back pain.  Take warm sitz baths to soothe any pain or discomfort caused by hemorrhoids. Use hemorrhoid cream if your health care provider approves.  Wear a good support bra to prevent discomfort from breast tenderness.  If you develop varicose veins: ? Wear support pantyhose or compression stockings as told by your healthcare provider. ? Elevate your feet for 15 minutes, 3-4 times a day. Prenatal care  Write down your questions. Take them to your prenatal visits.  Keep all your prenatal visits as told by your health care provider. This is important. Safety  Wear your seat belt at  all times when driving.  Make a list of emergency phone numbers, including numbers for family, friends, the hospital, and police and fire departments. General instructions  Avoid cat litter boxes and soil used by cats. These carry germs that can cause birth defects in the baby. If you have a cat, ask someone to clean the litter box for you.  Do not travel far distances unless it is absolutely necessary and only with the approval of your health care provider.  Do not use hot tubs, steam rooms, or saunas.  Do not drink alcohol.  Do not use any products that contain nicotine or tobacco, such as cigarettes and e-cigarettes. If you need help quitting, ask your health care provider.  Do not use any medicinal herbs or unprescribed drugs. These chemicals affect the formation and growth of the baby.  Do not douche or use tampons or scented sanitary pads.  Do not cross your legs for long periods of time.  To prepare for the arrival of your baby: ? Take prenatal classes to understand, practice, and ask questions about labor and delivery. ? Make a trial run to the hospital. ? Visit the hospital and tour the maternity area. ? Arrange for maternity or paternity leave through employers. ? Arrange for family and friends to take care of pets while you are in the hospital. ? Purchase a rear-facing car seat and make sure you know how to install it in your car. ? Pack your hospital bag. ? Prepare the baby's nursery. Make sure to remove all pillows and stuffed animals from the baby's crib to prevent suffocation.  Visit your dentist if you have not gone during your pregnancy. Use a soft toothbrush to brush your teeth and be gentle when you floss. Contact a health care provider if:  You are unsure if you are in labor or if your water has broken.  You become dizzy.  You have mild pelvic cramps, pelvic pressure, or nagging pain in your abdominal area.  You have lower back pain.  You have persistent  nausea, vomiting, or diarrhea.  You have an unusual or bad smelling vaginal discharge.  You have pain when you urinate. Get help right away if:  Your water breaks before 37 weeks.  You have regular contractions less than 5 minutes apart before 37 weeks.  You have a fever.  You are leaking fluid from your vagina.  You have spotting or bleeding from your vagina.  You have severe abdominal pain or cramping.  You have rapid weight loss or weight gain.    You have shortness of breath with chest pain.  You notice sudden or extreme swelling of your face, hands, ankles, feet, or legs.  Your baby makes fewer than 10 movements in 2 hours.  You have severe headaches that do not go away when you take medicine.  You have vision changes. Summary  The third trimester is from week 28 through week 40, months 7 through 9. The third trimester is a time when the unborn baby (fetus) is growing rapidly.  During the third trimester, your discomfort may increase as you and your baby continue to gain weight. You may have abdominal, leg, and back pain, sleeping problems, and an increased need to urinate.  During the third trimester your breasts will keep growing and they will continue to become tender. A yellow fluid (colostrum) may leak from your breasts. This is the first milk you are producing for your baby.  False labor is a condition in which you feel small, irregular tightenings of the muscles in the womb (contractions) that eventually go away. These are called Braxton Hicks contractions. Contractions may last for hours, days, or even weeks before true labor sets in.  Signs of labor can include: abdominal cramps; regular contractions that start at 10 minutes apart and become stronger and more frequent with time; watery or bloody mucus discharge that comes from the vagina; increased pelvic pressure and dull back pain; and leaking of amniotic fluid. This information is not intended to replace advice  given to you by your health care provider. Make sure you discuss any questions you have with your health care provider. Document Released: 01/20/2001 Document Revised: 07/04/2015 Document Reviewed: 03/29/2012 [employer] Interactive Patient Education  2017 [employer]  

## 2017-05-27 NOTE — Progress Notes (Signed)
ROB Declined TDAP/blood form signed

## 2017-06-07 ENCOUNTER — Observation Stay
Admission: EM | Admit: 2017-06-07 | Discharge: 2017-06-07 | Disposition: A | Discharge status: Home / Self Care | Payer: Medicaid Other | Attending: Certified Nurse Midwife | Admitting: Certified Nurse Midwife

## 2017-06-07 ENCOUNTER — Other Ambulatory Visit: Payer: Self-pay

## 2017-06-07 DIAGNOSIS — Z3A33 33 weeks gestation of pregnancy: Secondary | ICD-10-CM | POA: Insufficient documentation

## 2017-06-07 DIAGNOSIS — Z803 Family history of malignant neoplasm of breast: Secondary | ICD-10-CM | POA: Diagnosis not present

## 2017-06-07 DIAGNOSIS — Z91013 Allergy to seafood: Secondary | ICD-10-CM | POA: Diagnosis not present

## 2017-06-07 DIAGNOSIS — O4700 False labor before 37 completed weeks of gestation, unspecified trimester: Secondary | ICD-10-CM | POA: Diagnosis present

## 2017-06-07 DIAGNOSIS — Z8249 Family history of ischemic heart disease and other diseases of the circulatory system: Secondary | ICD-10-CM | POA: Diagnosis not present

## 2017-06-07 DIAGNOSIS — Z3480 Encounter for supervision of other normal pregnancy, unspecified trimester: Secondary | ICD-10-CM

## 2017-06-07 HISTORY — DX: Anaphylactic shock, unspecified, initial encounter: T78.2XXA

## 2017-06-07 LAB — CHLAMYDIA/NGC RT PCR (ARMC ONLY)
Chlamydia Tr: NOT DETECTED
N GONORRHOEAE: NOT DETECTED

## 2017-06-07 LAB — FETAL FIBRONECTIN: Fetal Fibronectin: POSITIVE — AB

## 2017-06-07 LAB — URINALYSIS, COMPLETE (UACMP) WITH MICROSCOPIC
Bilirubin Urine: NEGATIVE
Glucose, UA: NEGATIVE mg/dL
Hgb urine dipstick: NEGATIVE
Ketones, ur: 5 mg/dL — AB
Nitrite: NEGATIVE
Protein, ur: NEGATIVE mg/dL
SPECIFIC GRAVITY, URINE: 1.013 (ref 1.005–1.030)
pH: 6 (ref 5.0–8.0)

## 2017-06-07 MED ORDER — TERBUTALINE SULFATE 1 MG/ML IJ SOLN
0.2500 mg | Freq: Once | INTRAMUSCULAR | Status: AC
  Administered 2017-06-07: 0.25 mg via SUBCUTANEOUS
  Filled 2017-06-07: qty 1

## 2017-06-07 NOTE — Final Progress Note (Signed)
Physician Final Progress Note  Patient ID: Cristina Le MRN: [id number] DOB/AGE: Apr 13, 1990 27 y.o.  Admit date: 06/07/2017 Admitting provider: Vena Austria, MD/ Gasper Lloyd. Sharen Hones, CNM Discharge date: 06/08/2017   Admission Diagnoses: IUP at 33 wk 3 days with threatened preterm labor  Discharge Diagnoses: IUP at [phone] with threatened preterm labor   Consults: None  Significant Findings/ Diagnostic Studies: 27 year old G3 P2002 with EDC=07/23/2017 by LMP=12 week ultrasound,  presented at 33wk 3 days with complaints of contractions every 5 minutes since 1700 this evening. Denies nausea, vomiting, diarrhea, dysuria, or vaginal bleeding. No trauma. Baby active. Rating contraction pain 5/10. DId have intercourse the evening of 4/28.  History of term vaginal deliveries x 2 (both labors induced). Had vaginal bleeding in labor with G2-thought to be abruption. Prenatal care at [facility] has been complicated by  anaphylactic syndrome (nausea/vomiting/diarrhea/hives)-treated with Benadryl and prednisone at 11 weeks pregnancy.  Her care has also been remarkable for: [facility] Prenatal Labs  Dating LMP=12w 5d Ultrasound Blood type: A/Positive/-- (11/27 1538)   Genetic Screen 1 Screen: Negative   msafp neg Antibody:Negative (11/27 1538)  Anatomic [place] Baby girl Rubella: 1.89 (11/27 1538) Varicella: Immune  GTT Early:               Third trimester: 81 RPR: Non Reactive (11/27 1538)   Rhogam  HBsAg: Negative (11/27 1538)   TDaP vaccine   declined                    Flu Shot: HIV:   Non Reactive  Baby Food                Breast                GBS:   Contraception TL--30 days papers 04/29/17 Pap: 04/11/2015 NIL  CBB     CS/VBAC    Support Person          Past Medical History:  Diagnosis Date  . Anaphylactic reaction   . History of Papanicolaou smear of cervix 01/14/12; 04/11/15   NEG; NEG, CT/GC/TR NEG;   Past Surgical History:  Procedure Laterality Date  . NO PAST SURGERIES      OB History  Gravida Para Term Preterm AB Living  0 0 2  SAB TAB Ectopic Multiple Live Births  0 0 0 0 2    # Outcome Date GA Lbr Len/2nd Weight Sex Delivery Anes PTL Lv  3 Current           2 Term 11/29/15 [redacted]w[redacted]d  3.827 kg (8 lb 7 oz) M Vag-Spont   LIV  1 Term 08/20/12 [redacted]w[redacted]d  3.856 kg (8 lb 8 oz) F Vag-Spont EPI N LIV   Family History  Problem Relation Age of Onset  . Breast cancer Maternal Grandmother 7  . Heart disease Paternal Grandfather    Social History   Socioeconomic History  . Marital status: Married    Spouse name: Not on file  . Number of children: 2  . Years of education: 33  . Highest education level: Not on file  Occupational History  . 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   Social Needs  . Financial resource strain: Not on file  . Food insecurity:    Worry: Not on file    Inability: Not on file  . Transportation needs:    Medical: Not on file  Non-medical: Not on file  Tobacco Use  . Smoking status: Never Smoker  . Smokeless tobacco: Never Used  Substance and Sexual Activity  . Alcohol use: No  . Drug use: No  . Sexual activity: Yes  Lifestyle  . Physical activity:    Days per week: Not on file    Minutes per session: Not on file  . Stress: Not on file  Relationships  . Social connections:    Talks on phone: Not on file    Gets together: Not on file    Attends religious service: Not on file    Active member of club or organization: Not on file    Attends meetings of clubs or organizations: Not on file    Relationship status: Not on file  . Intimate partner violence:    Fear of current or ex partner: Not on file    Emotionally abused: Not on file    Physically abused: Not on file    Forced sexual activity: Not on file  Other Topics Concern  . Not on file  Social History Narrative  . Not on file   Exam: BP 108/65 (BP Location: Right Arm)   Pulse 72   Temp 97.8 F (36.6 C)  (Oral)   Resp 16   Ht  (1.727 m)   Wt 77.6 kg (171 lb)   LMP 10/16/2016 (Exact Date)   SpO2 100%   BMI 26.00 kg/m   General: WF, gravid, in NAD, well nourished Heart: RRR without murmur Lungs: normal respiratory effort/ CTAB Abdomen: soft, contraction palpable,uterus tender with contraction Pelvic exam: External/BUS: no lesions, discharge, or inflammation Vagina: clear mucoid discharge Cervix: thick/closed/-3  wet prep: negative for Trich, clue cells, or hyphae FHR: 135 baseline with accelerations to 150s to 160s, moderate variability Toco: contractions every 3-5 min apart x 40-70 sec in duration on arrival. Neruo: alert, oriented x 3 Psyche: mood and affect normal. Ultrasound: cephalic presentation/OA  Labs:   Results for orders placed or performed during the hospital encounter of 06/07/17 (from the past 24 hour(s))  Urinalysis, Complete w Microscopic     Status: Abnormal   Collection Time: 06/07/17  6:55 PM  Result Value Ref Range   Color, Urine YELLOW (A) YELLOW   APPearance HAZY (A) CLEAR   Specific Gravity, Urine 1.013 1.005 - 1.030   pH 6.0 5.0 - 8.0   Glucose, UA NEGATIVE NEGATIVE mg/dL   Hgb urine dipstick NEGATIVE NEGATIVE   Bilirubin Urine NEGATIVE NEGATIVE   Ketones, ur 5 (A) NEGATIVE mg/dL   Protein, ur NEGATIVE NEGATIVE mg/dL   Nitrite NEGATIVE NEGATIVE   Leukocytes, UA TRACE (A) NEGATIVE   RBC / HPF 6-10 0 - 5 RBC/hpf   WBC, UA 6-10 0 - 5 WBC/hpf   Bacteria, UA FEW (A) NONE SEEN   Squamous Epithelial / LPF 0-5 0 - 5   Mucus PRESENT   Chlamydia/NGC rt PCR ([facility] only)     Status: None   Collection Time: 06/07/17  7:24 PM  Result Value Ref Range   Specimen source GC/Chlam VAGINAL/RECTAL    Chlamydia Tr NOT DETECTED NOT DETECTED   N gonorrhoeae NOT DETECTED NOT DETECTED  Fetal fibronectin     Status: Abnormal   Collection Time: 06/07/17  7:24 PM  Result Value Ref Range   Fetal Fibronectin POSITIVE (A) NEGATIVE   Appearance, FETFIB CLOUDY (A) CLEAR    A:  IUP at 33 wk 3 days with threatened preterm labor R/O UTI FWB: Cat  1 tracing False positive FFN (should not have been done because had IC < 24 hours before test)  P: urine culture Terbutaline 0.25 mgm subcut. X 1 after which contractions stopped completely. Patient discharged home with PTL precautions Follow up on 5//2 in office-can repeat FFN at that time.(no IC until then)    Procedures: none  Discharge Condition: stable  Disposition: home  Diet: Regular diet  Discharge Activity: Pelvic rest   Allergies as of 06/07/2017      Reactions   Fish Allergy Anaphylaxis   Fish-derived Products Anaphylaxis      Medication List    TAKE these medications   EPIPEN 2-PAK 0.3 mg/0.3 mL Soaj injection Generic drug:  EPINEPHrine 1 IM INJECTION AS NEEDED   multivitamin-prenatal 27-0.8 MG Tabs tablet Take 1 tablet by mouth daily at 12 noon.        Total time spent taking care of this patient: 30 minutes  Signed: Farrel Conners 06/08/2017, 2:40 AM

## 2017-06-07 NOTE — Discharge Instructions (Signed)
Braxton Hicks Contractions °Contractions of the uterus can occur throughout pregnancy, but they are not always a sign that you are in labor. You may have practice contractions called Braxton Hicks contractions. These false labor contractions are sometimes confused with true labor. °What are Braxton Hicks contractions? °Braxton Hicks contractions are tightening movements that occur in the muscles of the uterus before labor. Unlike true labor contractions, these contractions do not result in opening (dilation) and thinning of the cervix. Toward the end of pregnancy (32-34 weeks), Braxton Hicks contractions can happen more often and may become stronger. These contractions are sometimes difficult to tell apart from true labor because they can be very uncomfortable. You should not feel embarrassed if you go to the hospital with false labor. °Sometimes, the only way to tell if you are in true labor is for your health care provider to look for changes in the cervix. The health care provider will do a physical exam and may monitor your contractions. If you are not in true labor, the exam should show that your cervix is not dilating and your water has not broken. °If there are other health problems associated with your pregnancy, it is completely safe for you to be sent home with false labor. You may continue to have Braxton Hicks contractions until you go into true labor. °How to tell the difference between true labor and false labor °True labor °· Contractions last 30-70 seconds. °· Contractions become very regular. °· Discomfort is usually felt in the top of the uterus, and it spreads to the lower abdomen and low back. °· Contractions do not go away with walking. °· Contractions usually become more intense and increase in frequency. °· The cervix dilates and gets thinner. °False labor °· Contractions are usually shorter and not as strong as true labor contractions. °· Contractions are usually irregular. °· Contractions  are often felt in the front of the lower abdomen and in the groin. °· Contractions may go away when you walk around or change positions while lying down. °· Contractions get weaker and are shorter-lasting as time goes on. °· The cervix usually does not dilate or become thin. °Follow these instructions at home: °· Take over-the-counter and prescription medicines only as told by your health care provider. °· Keep up with your usual exercises and follow other instructions from your health care provider. °· Eat and drink lightly if you think you are going into labor. °· If Braxton Hicks contractions are making you uncomfortable: °? Change your position from lying down or resting to walking, or change from walking to resting. °? Sit and rest in a tub of warm water. °? Drink enough fluid to keep your urine pale yellow. Dehydration may cause these contractions. °? Do slow and deep breathing several times an hour. °· Keep all follow-up prenatal visits as told by your health care provider. This is important. °Contact a health care provider if: °· You have a fever. °· You have continuous pain in your abdomen. °Get help right away if: °· Your contractions become stronger, more regular, and closer together. °· You have fluid leaking or gushing from your vagina. °· You pass blood-tinged mucus (bloody show). °· You have bleeding from your vagina. °· You have low back pain that you never had before. °· You feel your baby’s head pushing down and causing pelvic pressure. °· Your baby is not moving inside you as much as it used to. °Summary °· Contractions that occur before labor are called Braxton   Hicks contractions, false labor, or practice contractions. °· Braxton Hicks contractions are usually shorter, weaker, farther apart, and less regular than true labor contractions. True labor contractions usually become progressively stronger and regular and they become more frequent. °· Manage discomfort from Braxton Hicks contractions by  changing position, resting in a warm bath, drinking plenty of water, or practicing deep breathing. °This information is not intended to replace advice given to you by your health care provider. Make sure you discuss any questions you have with your health care provider. °Document Released: 06/11/2016 Document Revised: 06/11/2016 Document Reviewed: 06/11/2016 °Elsevier Interactive Patient Education © 2018 [employer] ° °

## 2017-06-07 NOTE — OB Triage Note (Signed)
Pt presents c/o of ctx that started about an hour and half a go.  She states that her contractions are 5 mins apart. Denies any bleeding or LOF. Reports positive fetal movement. Patient states that she has had plenty of fluids today. Pt reports last intercourse was last night. Denies any NVD. Vitals WNL. Will continue to monitor.

## 2017-06-08 ENCOUNTER — Encounter: Payer: Self-pay | Admitting: Certified Nurse Midwife

## 2017-06-09 LAB — URINE CULTURE: Special Requests: NORMAL

## 2017-06-10 ENCOUNTER — Ambulatory Visit (INDEPENDENT_AMBULATORY_CARE_PROVIDER_SITE_OTHER): Payer: Medicaid Other | Admitting: Advanced Practice Midwife

## 2017-06-10 ENCOUNTER — Encounter: Payer: Self-pay | Admitting: Advanced Practice Midwife

## 2017-06-10 VITALS — BP 104/64 | Wt 172.0 lb

## 2017-06-10 DIAGNOSIS — Z3A33 33 weeks gestation of pregnancy: Secondary | ICD-10-CM

## 2017-06-10 NOTE — Progress Notes (Signed)
No vb. No lof.  

## 2017-06-10 NOTE — Progress Notes (Signed)
  Routine Prenatal Care Visit  Subjective  Cristina Le is a 27 y.o. G3P2002 at [redacted]w[redacted]d being seen today for ongoing prenatal care.  She is currently monitored for the following issues for this low-risk pregnancy and has Supervision of other normal pregnancy, antepartum; Allergic reaction, history of; Anaphylactic syndrome; and Threatened preterm labor on their problem list.  ----------------------------------------------------------------------------------- Patient reports no complaints.  Patient still prefers IOL at 40 weeks.  Contractions: Not present. Vag. Bleeding: None.  Movement: Present. Denies leaking of fluid.  ----------------------------------------------------------------------------------- The following portions of the patient's history were reviewed and updated as appropriate: allergies, current medications, past family history, past medical history, past social history, past surgical history and problem list. Problem list updated.   Objective  Blood pressure 104/64, weight 172 lb (78 kg), last menstrual period 10/16/2016, unknown if currently breastfeeding. Pregravid weight 140 lb (63.5 kg) Total Weight Gain 32 lb (14.5 kg) Urinalysis: Urine Protein: Negative Urine Glucose: Negative  Fetal Status: Fetal Heart Rate (bpm): 142 Fundal Height: 32 cm Movement: Present     General:  Alert, oriented and cooperative. Patient is in no acute distress.  Skin: Skin is warm and dry. No rash noted.   Cardiovascular: Normal heart rate noted  Respiratory: Normal respiratory effort, no problems with respiration noted  Abdomen: Soft, gravid, appropriate for gestational age. Pain/Pressure: Present     Pelvic:  Cervical exam deferred        Extremities: Normal range of motion.  Edema: None  Mental Status: Normal mood and affect. Normal behavior. Normal judgment and thought content.   Assessment   27 y.o. G3P2002 at [redacted]w[redacted]d by  07/23/2017, by Last Menstrual Period presenting for routine  prenatal visit  Plan   THIRD  Problems (from 01/05/17 to present)    Problem Noted Resolved   Supervision of other normal pregnancy, antepartum 01/05/2017 by Oswaldo Conroy, CNM No   Overview Addendum 05/13/2017  5:41 PM by Farrel Conners, CNM    [facility] Prenatal Labs  Dating LMP=12w 5d Ultrasound Blood type: A/Positive/-- (11/27 1538)   Genetic Screen 1 Screen: Negative   msafp neg Antibody:Negative (11/27 1538)  Anatomic [place] Baby girl Rubella: 1.89 (11/27 1538) Varicella: Immune  GTT Early:               Third trimester:  RPR: Non Reactive (11/27 1538)   Rhogam  HBsAg: Negative (11/27 1538)   TDaP vaccine                       Flu Shot: HIV:   Non Reactive  Baby Food                Breast                GBS:   Contraception TL--30 days papers 04/29/17 Pap:  CBB     CS/VBAC    Support Person                  Preterm labor symptoms and general obstetric precautions including but not limited to vaginal bleeding, contractions, leaking of fluid and fetal movement were reviewed in detail with the patient.   Return in about 2 weeks (around 06/24/2017) for rob.  Tresea Mall, CNM 06/10/2017 2:14 PM

## 2017-06-25 ENCOUNTER — Encounter: Payer: Self-pay | Admitting: Maternal Newborn

## 2017-06-25 ENCOUNTER — Ambulatory Visit (INDEPENDENT_AMBULATORY_CARE_PROVIDER_SITE_OTHER): Payer: Medicaid Other | Admitting: Maternal Newborn

## 2017-06-25 VITALS — BP 90/50 | Wt 173.0 lb

## 2017-06-25 DIAGNOSIS — Z113 Encounter for screening for infections with a predominantly sexual mode of transmission: Secondary | ICD-10-CM

## 2017-06-25 DIAGNOSIS — Z3480 Encounter for supervision of other normal pregnancy, unspecified trimester: Secondary | ICD-10-CM

## 2017-06-25 DIAGNOSIS — Z3A36 36 weeks gestation of pregnancy: Secondary | ICD-10-CM

## 2017-06-25 NOTE — Progress Notes (Signed)
No concerns.rj 

## 2017-06-25 NOTE — Progress Notes (Signed)
    Routine Prenatal Care Visit  Subjective  Cristina Le is a 27 y.o. G3P2002 at [redacted]w[redacted]d being seen today for ongoing prenatal care.  She is currently monitored for the following issues for this low-risk pregnancy and has Supervision of other normal pregnancy, antepartum; Allergic reaction, history of; Anaphylactic syndrome; and Threatened preterm labor on their problem list.  ----------------------------------------------------------------------------------- Patient reports no complaints.   Contractions: Not present. Vag. Bleeding: None.  Movement: Present. Denies leaking of fluid.  ----------------------------------------------------------------------------------- The following portions of the patient's history were reviewed and updated as appropriate: allergies, current medications, past family history, past medical history, past social history, past surgical history and problem list. Problem list updated.   Objective  Blood pressure (!) 90/50, weight 173 lb (78.5 kg), last menstrual period 10/16/2016, unknown if currently breastfeeding. Pregravid weight 140 lb (63.5 kg) Total Weight Gain 33 lb (15 kg) Urinalysis: Urine Protein: Negative Urine Glucose: Negative  Fetal Status: Fetal Heart Rate (bpm): 140 Fundal Height: 35 cm Movement: Present     General:  Alert, oriented and cooperative. Patient is in no acute distress.  Skin: Skin is warm and dry. No rash noted.   Cardiovascular: Normal heart rate noted  Respiratory: Normal respiratory effort, no problems with respiration noted  Abdomen: Soft, gravid, appropriate for gestational age. Pain/Pressure: Absent     Pelvic:  Cervical exam deferred        Extremities: Normal range of motion.  Edema: None  Mental Status: Normal mood and affect. Normal behavior. Normal judgment and thought content.    Assessment   27 y.o. [id number] at [redacted]w[redacted]d, EDD 07/23/2017 by Last Menstrual Period presenting for routine prenatal visit.  Plan   THIRD   Problems (from 01/05/17 to present)    Problem Noted Resolved   Supervision of other normal pregnancy, antepartum 01/05/2017 by Oswaldo Conroy, CNM No   Overview Addendum 05/13/2017  5:41 PM by Farrel Conners, CNM    [facility] Prenatal Labs  Dating LMP=12w 5d Ultrasound Blood type: A/Positive/-- (11/27 1538)   Genetic Screen 1 Screen: Negative   msafp neg Antibody:Negative (11/27 1538)  Anatomic [place] Baby girl Rubella: 1.89 (11/27 1538) Varicella: Immune  GTT Early:               Third trimester:  RPR: Non Reactive (11/27 1538)   Rhogam  HBsAg: Negative (11/27 1538)   TDaP vaccine                       Flu Shot: HIV:   Non Reactive  Baby Food                Breast                GBS:   Contraception TL--30 days papers 04/29/17 Pap:  CBB     CS/VBAC    Support Person               GBS and GC collected today.  Desires induction of labor at 40 weeks, will schedule and notify her of date/time.  Preterm labor symptoms and general obstetric precautions including but not limited to vaginal bleeding, contractions, leaking of fluid and fetal movement were reviewed.  Return in about 1 week (around 07/02/2017) for ROB.  Marcelyn Bruins, CNM 06/25/2017  2:56 PM

## 2017-06-28 LAB — STREP GP B NAA: STREP GROUP B AG: NEGATIVE

## 2017-06-28 LAB — GC/CHLAMYDIA PROBE AMP
CHLAMYDIA, DNA PROBE: NEGATIVE
NEISSERIA GONORRHOEAE BY PCR: NEGATIVE

## 2017-07-02 ENCOUNTER — Encounter: Payer: Self-pay | Admitting: Advanced Practice Midwife

## 2017-07-02 ENCOUNTER — Ambulatory Visit (INDEPENDENT_AMBULATORY_CARE_PROVIDER_SITE_OTHER): Payer: Medicaid Other | Admitting: Advanced Practice Midwife

## 2017-07-02 VITALS — BP 100/58 | Wt 174.0 lb

## 2017-07-02 DIAGNOSIS — Z3A37 37 weeks gestation of pregnancy: Secondary | ICD-10-CM

## 2017-07-02 NOTE — Patient Instructions (Signed)
Vaginal Delivery Vaginal delivery means that you will give birth by pushing your baby out of your birth canal (vagina). A team of health care providers will help you before, during, and after vaginal delivery. Birth experiences are unique for every woman and every pregnancy, and birth experiences vary depending on where you choose to give birth. What should I do to prepare for my baby's birth? Before your baby is born, it is important to talk with your health care provider about:  Your labor and delivery preferences. These may include: ? Medicines that you may be given. ? How you will manage your pain. This might include non-medical pain relief techniques or injectable pain relief such as epidural analgesia. ? How you and your baby will be monitored during labor and delivery. ? Who may be in the labor and delivery room with you. ? Your feelings about surgical delivery of your baby (cesarean delivery, or C-section) if this becomes necessary. ? Your feelings about receiving donated blood through an IV tube (blood transfusion) if this becomes necessary.  Whether you are able: ? To take pictures or videos of the birth. ? To eat during labor and delivery. ? To move around, walk, or change positions during labor and delivery.  What to expect after your baby is born, such as: ? Whether delayed umbilical cord clamping and cutting is offered. ? Who will care for your baby right after birth. ? Medicines or tests that may be recommended for your baby. ? Whether breastfeeding is supported in your hospital or birth center. ? How long you will be in the hospital or birth center.  How any medical conditions you have may affect your baby or your labor and delivery experience.  To prepare for your baby's birth, you should also:  Attend all of your health care visits before delivery (prenatal visits) as recommended by your health care provider. This is important.  Prepare your home for your baby's  arrival. Make sure that you have: ? Diapers. ? Baby clothing. ? Feeding equipment. ? Safe sleeping arrangements for you and your baby.  Install a car seat in your vehicle. Have your car seat checked by a certified car seat installer to make sure that it is installed safely.  Think about who will help you with your new baby at home for at least the first several weeks after delivery.  What can I expect when I arrive at the birth center or hospital? Once you are in labor and have been admitted into the hospital or birth center, your health care provider may:  Review your pregnancy history and any concerns you have.  Insert an IV tube into one of your veins. This is used to give you fluids and medicines.  Check your blood pressure, pulse, temperature, and heart rate (vital signs).  Check whether your bag of water (amniotic sac) has broken (ruptured).  Talk with you about your birth plan and discuss pain control options.  Monitoring Your health care provider may monitor your contractions (uterine monitoring) and your baby's heart rate (fetal monitoring). You may need to be monitored:  Often, but not continuously (intermittently).  All the time or for long periods at a time (continuously). Continuous monitoring may be needed if: ? You are taking certain medicines, such as medicine to relieve pain or make your contractions stronger. ? You have pregnancy or labor complications.  Monitoring may be done by:  Placing a special stethoscope or a handheld monitoring device on your abdomen to   check your baby's heartbeat, and feeling your abdomen for contractions. This method of monitoring does not continuously record your baby's heartbeat or your contractions.  Placing monitors on your abdomen (external monitors) to record your baby's heartbeat and the frequency and length of contractions. You may not have to wear external monitors all the time.  Placing monitors inside of your uterus  (internal monitors) to record your baby's heartbeat and the frequency, length, and strength of your contractions. ? Your health care provider may use internal monitors if he or she needs more information about the strength of your contractions or your baby's heart rate. ? Internal monitors are put in place by passing a thin, flexible wire through your vagina and into your uterus. Depending on the type of monitor, it may remain in your uterus or on your baby's head until birth. ? Your health care provider will discuss the benefits and risks of internal monitoring with you and will ask for your permission before inserting the monitors.  Telemetry. This is a type of continuous monitoring that can be done with external or internal monitors. Instead of having to stay in bed, you are able to move around during telemetry. Ask your health care provider if telemetry is an option for you.  Physical exam Your health care provider may perform a physical exam. This may include:  Checking whether your baby is positioned: ? With the head toward your vagina (head-down). This is most common. ? With the head toward the top of your uterus (head-up or breech). If your baby is in a breech position, your health care provider may try to turn your baby to a head-down position so you can deliver vaginally. If it does not seem that your baby can be born vaginally, your provider may recommend surgery to deliver your baby. In rare cases, you may be able to deliver vaginally if your baby is head-up (breech delivery). ? Lying sideways (transverse). Babies that are lying sideways cannot be delivered vaginally.  Checking your cervix to determine: ? Whether it is thinning out (effacing). ? Whether it is opening up (dilating). ? How low your baby has moved into your birth canal.  What are the three stages of labor and delivery?  Normal labor and delivery is divided into the following three stages: Stage 1  Stage 1 is the  longest stage of labor, and it can last for hours or days. Stage 1 includes: ? Early labor. This is when contractions may be irregular, or regular and mild. Generally, early labor contractions are more than 10 minutes apart. ? Active labor. This is when contractions get longer, more regular, more frequent, and more intense. ? The transition phase. This is when contractions happen very close together, are very intense, and may last longer than during any other part of labor.  Contractions generally feel mild, infrequent, and irregular at first. They get stronger, more frequent (about every 2-3 minutes), and more regular as you progress from early labor through active labor and transition.  Many women progress through stage 1 naturally, but you may need help to continue making progress. If this happens, your health care provider may talk with you about: ? Rupturing your amniotic sac if it has not ruptured yet. ? Giving you medicine to help make your contractions stronger and more frequent.  Stage 1 ends when your cervix is completely dilated to 4 inches (10 cm) and completely effaced. This happens at the end of the transition phase. Stage 2  Once   your cervix is completely effaced and dilated to 4 inches (10 cm), you may start to feel an urge to push. It is common for the body to naturally take a rest before feeling the urge to push, especially if you received an epidural or certain other pain medicines. This rest period may last for up to 1-2 hours, depending on your unique labor experience.  During stage 2, contractions are generally less painful, because pushing helps relieve contraction pain. Instead of contraction pain, you may feel stretching and burning pain, especially when the widest part of your baby's head passes through the vaginal opening (crowning).  Your health care provider will closely monitor your pushing progress and your baby's progress through the vagina during stage 2.  Your  health care provider may massage the area of skin between your vaginal opening and anus (perineum) or apply warm compresses to your perineum. This helps it stretch as the baby's head starts to crown, which can help prevent perineal tearing. ? In some cases, an incision may be made in your perineum (episiotomy) to allow the baby to pass through the vaginal opening. An episiotomy helps to make the opening of the vagina larger to allow more room for the baby to fit through.  It is very important to breathe and focus so your health care provider can control the delivery of your baby's head. Your health care provider may have you decrease the intensity of your pushing, to help prevent perineal tearing.  After delivery of your baby's head, the shoulders and the rest of the body generally deliver very quickly and without difficulty.  Once your baby is delivered, the umbilical cord may be cut right away, or this may be delayed for 1-2 minutes, depending on your baby's health. This may vary among health care providers, hospitals, and birth centers.  If you and your baby are healthy enough, your baby may be placed on your chest or abdomen to help maintain the baby's temperature and to help you bond with each other. Some mothers and babies start breastfeeding at this time. Your health care team will dry your baby and help keep your baby warm during this time.  Your baby may need immediate care if he or she: ? Showed signs of distress during labor. ? Has a medical condition. ? Was born too early (prematurely). ? Had a bowel movement before birth (meconium). ? Shows signs of difficulty transitioning from being inside the uterus to being outside of the uterus. If you are planning to breastfeed, your health care team will help you begin a feeding. Stage 3  The third stage of labor starts immediately after the birth of your baby and ends after you deliver the placenta. The placenta is an organ that develops  during pregnancy to provide oxygen and nutrients to your baby in the womb.  Delivering the placenta may require some pushing, and you may have mild contractions. Breastfeeding can stimulate contractions to help you deliver the placenta.  After the placenta is delivered, your uterus should tighten (contract) and become firm. This helps to stop bleeding in your uterus. To help your uterus contract and to control bleeding, your health care provider may: ? Give you medicine by injection, through an IV tube, by mouth, or through your rectum (rectally). ? Massage your abdomen or perform a vaginal exam to remove any blood clots that are left in your uterus. ? Empty your bladder by placing a thin, flexible tube (catheter) into your bladder. ? Encourage   you to breastfeed your baby. After labor is over, you and your baby will be monitored closely to ensure that you are both healthy until you are ready to go home. Your health care team will teach you how to care for yourself and your baby. This information is not intended to replace advice given to you by your health care provider. Make sure you discuss any questions you have with your health care provider. Document Released: 11/05/2007 Document Revised: 08/16/2015 Document Reviewed: 02/10/2015 Elsevier Interactive Patient Education  2018 [employer]  

## 2017-07-02 NOTE — Progress Notes (Signed)
  Routine Prenatal Care Visit  Subjective  Cristina Le is a 27 y.o. G3P2002 at [redacted]w[redacted]d being seen today for ongoing prenatal care.  She is currently monitored for the following issues for this low-risk pregnancy and has Supervision of other normal pregnancy, antepartum; Allergic reaction, history of; Anaphylactic syndrome; and Threatened preterm labor on their problem list.  ----------------------------------------------------------------------------------- Patient reports no complaints.   Contractions: Not present. Vag. Bleeding: None.  Movement: Present. Denies leaking of fluid.  ----------------------------------------------------------------------------------- The following portions of the patient's history were reviewed and updated as appropriate: allergies, current medications, past family history, past medical history, past social history, past surgical history and problem list. Problem list updated.   Objective  Blood pressure (!) 100/58, weight 174 lb (78.9 kg), last menstrual period 10/16/2016, unknown if currently breastfeeding. Pregravid weight 140 lb (63.5 kg) Total Weight Gain 34 lb (15.4 kg) Urinalysis:      Fetal Status: Fetal Heart Rate (bpm): 135 Fundal Height: 36 cm Movement: Present     General:  Alert, oriented and cooperative. Patient is in no acute distress.  Skin: Skin is warm and dry. No rash noted.   Cardiovascular: Normal heart rate noted  Respiratory: Normal respiratory effort, no problems with respiration noted  Abdomen: Soft, gravid, appropriate for gestational age. Pain/Pressure: Absent     Pelvic:  Cervical exam deferred        Extremities: Normal range of motion.  Edema: None  Mental Status: Normal mood and affect. Normal behavior. Normal judgment and thought content.   Assessment   27 y.o. G3P2002 at [redacted]w[redacted]d by  07/23/2017, by Last Menstrual Period presenting for routine prenatal visit  Plan   THIRD  Problems (from 01/05/17 to present)    Problem  Noted Resolved   Supervision of other normal pregnancy, antepartum 01/05/2017 by Oswaldo Conroy, CNM No   Overview Addendum 05/13/2017  5:41 PM by Farrel Conners, CNM    [facility]inic [facility] Prenatal Labs  Dating LMP=12w 5d Ultrasound Blood type: A/Positive/-- (11/27 1538)   Genetic Screen 1 Screen: Negative   msafp neg Antibody:Negative (11/27 1538)  Anatomic [place] Baby girl Rubella: 1.89 (11/27 1538) Varicella: Immune  GTT Early:               Third trimester:  RPR: Non Reactive (11/27 1538)   Rhogam  HBsAg: Negative (11/27 1538)   TDaP vaccine                       Flu Shot: HIV:   Non Reactive  Baby Food                Breast                GBS:   Contraception TL--30 days papers 04/29/17 Pap:  CBB     CS/VBAC    Support Person                  Term labor symptoms and general obstetric precautions including but not limited to vaginal bleeding, contractions, leaking of fluid and fetal movement were reviewed in detail with the patient. Please refer to After Visit Summary for other counseling recommendations.   Return in about 1 week (around 07/09/2017) for rob.  Tresea Mall, CNM 07/02/2017 1:48 PM

## 2017-07-02 NOTE — Progress Notes (Signed)
No c/o's. Declines cvx check today.

## 2017-07-09 ENCOUNTER — Encounter: Payer: Self-pay | Admitting: Obstetrics and Gynecology

## 2017-07-09 ENCOUNTER — Ambulatory Visit (INDEPENDENT_AMBULATORY_CARE_PROVIDER_SITE_OTHER): Payer: Medicaid Other | Admitting: Obstetrics and Gynecology

## 2017-07-09 ENCOUNTER — Other Ambulatory Visit (INDEPENDENT_AMBULATORY_CARE_PROVIDER_SITE_OTHER): Payer: Medicaid Other

## 2017-07-09 VITALS — BP 102/66 | Wt 178.0 lb

## 2017-07-09 DIAGNOSIS — Z3480 Encounter for supervision of other normal pregnancy, unspecified trimester: Secondary | ICD-10-CM

## 2017-07-09 DIAGNOSIS — O26843 Uterine size-date discrepancy, third trimester: Secondary | ICD-10-CM

## 2017-07-09 DIAGNOSIS — Z3A38 38 weeks gestation of pregnancy: Secondary | ICD-10-CM

## 2017-07-09 NOTE — Progress Notes (Signed)
ROB

## 2017-07-09 NOTE — Progress Notes (Signed)
Routine Prenatal Care Visit  Subjective  Cristina Le is a 27 y.o. G3P2002 at [redacted]w[redacted]d being seen today for ongoing prenatal care.  She is currently monitored for the following issues for this low-risk pregnancy and has Supervision of other normal pregnancy, antepartum; Allergic reaction, history of; Anaphylactic syndrome; and Threatened preterm labor on their problem list.  ----------------------------------------------------------------------------------- Patient reports no complaints.   Contractions: Not present. Vag. Bleeding: None.  Movement: Present. Denies leaking of fluid.  ----------------------------------------------------------------------------------- The following portions of the patient's history were reviewed and updated as appropriate: allergies, current medications, past family history, past medical history, past social history, past surgical history and problem list. Problem list updated.   Objective  Blood pressure 102/66, weight 178 lb (80.7 kg), last menstrual period 10/16/2016, unknown if currently breastfeeding. Pregravid weight 140 lb (63.5 kg) Total Weight Gain 38 lb (17.2 kg) Urinalysis: Urine Protein: Trace Urine Glucose: Negative  Fetal Status: Fetal Heart Rate (bpm): 140 Fundal Height: 34 cm Movement: Present     General:  Alert, oriented and cooperative. Patient is in no acute distress.  Skin: Skin is warm and dry. No rash noted.   Cardiovascular: Normal heart rate noted  Respiratory: Normal respiratory effort, no problems with respiration noted  Abdomen: Soft, gravid, appropriate for gestational age. Pain/Pressure: Absent     Pelvic:  Cervical exam performed Dilation: Closed Effacement (%): 0 Station: -3  Extremities: Normal range of motion.     ental Status: Normal mood and affect. Normal behavior. Normal judgment and thought content.     Assessment   27 y.o. G3P2002 at [redacted]w[redacted]d by  07/23/2017, by Last Menstrual Period presenting for routine prenatal  visit  Plan   THIRD  Problems (from 01/05/17 to present)    Problem Noted Resolved   Supervision of other normal pregnancy, antepartum 01/05/2017 by Oswaldo Conroy, CNM No   Overview Addendum 07/09/2017  1:52 PM by Natale Milch, MD    [facility] Prenatal Labs  Dating LMP=12w 5d Ultrasound Blood type: A/Positive/-- (11/27 1538)   Genetic Screen 1 Screen: Negative   msafp neg Antibody:Negative (11/27 1538)  Anatomic [place] Baby girl Rubella: 1.89 (11/27 1538) Varicella: Immune  GTT Early:                Third trimester: 81 RPR: Non Reactive (03/21 1203)   Rhogam Not applicable HBsAg: Negative (11/27 1538)   TDaP vaccine Declined                     Flu Shot: HIV: Non Reactive (03/21 1203) Non Reactive  Baby Food Breast                [id number] (05/17 1512)  Contraception TL--30 days papers 04/29/17 Pap:  CBB  Given informatipn   CS/VBAC Not applicable   Support Person                  Gestational age appropriate obstetric precautions including but not limited to vaginal bleeding, contractions, leaking of fluid and fetal movement were reviewed in detail with the patient.    Uterine size less than date, will obtain US today in office fro growth and AFI Membranes swept at patient's request Given information on cord blood banking. Given information on volunteer doula program at the hospital.   Update: Growth US WNL.  Rescheduled IOL at patient's request. For 6/16 at 8:00am.  Return in about 1 week (around 07/16/2017) for ROB.  Adelene Idler MD Lauralee Evener  OB/GYN, [facility] 07/09/17 1:52 PM

## 2017-07-16 ENCOUNTER — Encounter: Payer: Self-pay | Admitting: Advanced Practice Midwife

## 2017-07-16 ENCOUNTER — Ambulatory Visit (INDEPENDENT_AMBULATORY_CARE_PROVIDER_SITE_OTHER): Payer: Medicaid Other | Admitting: Advanced Practice Midwife

## 2017-07-16 VITALS — BP 98/58 | Wt 177.0 lb

## 2017-07-16 DIAGNOSIS — Z3A39 39 weeks gestation of pregnancy: Secondary | ICD-10-CM

## 2017-07-16 NOTE — Progress Notes (Signed)
No c/o's. Cx check today.

## 2017-07-16 NOTE — Progress Notes (Signed)
  Routine Prenatal Care Visit  Subjective  Cristina Le is a 27 y.o. G3P2002 at [redacted]w[redacted]d being seen today for ongoing prenatal care.  She is currently monitored for the following issues for this low-risk pregnancy and has Supervision of other normal pregnancy, antepartum; Allergic reaction, history of; Anaphylactic syndrome; and Threatened preterm labor on their problem list.  ----------------------------------------------------------------------------------- Patient reports no complaints.  Only occasional contraction. Contractions: Irregular. Vag. Bleeding: None.  Movement: Present. Denies leaking of fluid.  ----------------------------------------------------------------------------------- The following portions of the patient's history were reviewed and updated as appropriate: allergies, current medications, past family history, past medical history, past social history, past surgical history and problem list. Problem list updated.   Objective  Blood pressure (!) 98/58, weight 177 lb (80.3 kg), last menstrual period 10/16/2016 Pregravid weight 140 lb (63.5 kg) Total Weight Gain 37 lb (16.8 kg) Urinalysis: Urine Protein: Negative Urine Glucose: Negative  Fetal Status: Fetal Heart Rate (bpm): 140 Fundal Height: 36 cm Movement: Present     General:  Alert, oriented and cooperative. Patient is in no acute distress.  Skin: Skin is warm and dry. No rash noted.   Cardiovascular: Normal heart rate noted  Respiratory: Normal respiratory effort, no problems with respiration noted  Abdomen: Soft, gravid, appropriate for gestational age. Pain/Pressure: Present     Pelvic:  Cervical exam performed Dilation: Closed    External os is 1 cm  Extremities: Normal range of motion.  Edema: None  Mental Status: Normal mood and affect. Normal behavior. Normal judgment and thought content.   Assessment   27 y.o. G3P2002 at [redacted]w[redacted]d by  07/23/2017, by Last Menstrual Period presenting for routine prenatal  visit  Plan   THIRD  Problems (from 01/05/17 to present)    Problem Noted Resolved   Supervision of other normal pregnancy, antepartum 01/05/2017 by Oswaldo ConroySchmid, Jacelyn Y, CNM No   Overview Addendum 07/09/2017  1:52 PM by Natale MilchSchuman, Christanna R, MD    [facility] Prenatal Labs  Dating LMP=12w 5d Ultrasound Blood type: A/Positive/-- (11/27 1538)   Genetic Screen 1 Screen: Negative   msafp neg Antibody:Negative (11/27 1538)  Anatomic US Baby girl Rubella: 1.89 (11/27 1538) Varicella: Immune  GTT Early:                Third trimester: 81 RPR: Non Reactive (03/21 1203)   Rhogam Not applicable HBsAg: Negative (11/27 1538)   TDaP vaccine Declined                     Flu Shot: HIV: Non Reactive (03/21 1203) Non Reactive  Baby Food Breast                [id number]:Negative (05/17 1512)  Contraception TL--30 days papers 04/29/17 Pap:  CBB  Given informatipn   CS/VBAC Not applicable   Support Person                  Term labor symptoms and general obstetric precautions including but not limited to vaginal bleeding, contractions, leaking of fluid and fetal movement were reviewed in detail with the patient.   Return in about 1 week (around 07/23/2017) for rob.  Tresea MallJane Zurisadai Helminiak, CNM 07/16/2017 2:24 PM

## 2017-07-23 ENCOUNTER — Encounter: Payer: Medicaid Other | Admitting: Obstetrics & Gynecology

## 2017-07-23 ENCOUNTER — Ambulatory Visit (INDEPENDENT_AMBULATORY_CARE_PROVIDER_SITE_OTHER): Payer: Medicaid Other | Admitting: Certified Nurse Midwife

## 2017-07-23 VITALS — BP 90/50 | Wt 177.0 lb

## 2017-07-23 DIAGNOSIS — Z3A40 40 weeks gestation of pregnancy: Secondary | ICD-10-CM

## 2017-07-23 DIAGNOSIS — Z3483 Encounter for supervision of other normal pregnancy, third trimester: Secondary | ICD-10-CM

## 2017-07-23 DIAGNOSIS — Z3480 Encounter for supervision of other normal pregnancy, unspecified trimester: Secondary | ICD-10-CM

## 2017-07-23 NOTE — Progress Notes (Signed)
Pt reports no problems. Induction 07/25/17

## 2017-07-25 NOTE — Progress Notes (Signed)
ROB at 40 weeks: Doing well. Baby active. Is scheduled for elective induction in 2 days. Denies regular contractions, vaginal bleeding or leakage of fluid Declines cervical check

## 2017-07-26 ENCOUNTER — Telehealth: Payer: Self-pay | Admitting: Obstetrics & Gynecology

## 2017-07-26 ENCOUNTER — Ambulatory Visit (INDEPENDENT_AMBULATORY_CARE_PROVIDER_SITE_OTHER): Payer: Medicaid Other | Admitting: Obstetrics & Gynecology

## 2017-07-26 VITALS — BP 100/60 | Wt 178.0 lb

## 2017-07-26 DIAGNOSIS — Z3480 Encounter for supervision of other normal pregnancy, unspecified trimester: Secondary | ICD-10-CM

## 2017-07-26 DIAGNOSIS — Z3A40 40 weeks gestation of pregnancy: Secondary | ICD-10-CM

## 2017-07-26 DIAGNOSIS — Z3483 Encounter for supervision of other normal pregnancy, third trimester: Secondary | ICD-10-CM | POA: Diagnosis not present

## 2017-07-26 NOTE — Telephone Encounter (Signed)
Patient is schedule 07/26/17

## 2017-07-26 NOTE — Patient Instructions (Signed)
Labor Induction Labor induction is when steps are taken to cause a pregnant woman to begin the labor process. Most women go into labor on their own between 37 weeks and 42 weeks of the pregnancy. When this does not happen or when there is a medical need, methods may be used to induce labor. Labor induction causes a pregnant woman's uterus to contract. It also causes the cervix to soften (ripen), open (dilate), and thin out (efface). Usually, labor is not induced before 39 weeks of the pregnancy unless there is a problem with the baby or mother. Before inducing labor, your health care provider will consider a number of factors, including the following:  The medical condition of you and the baby.  How many weeks along you are.  The status of the baby's lung maturity.  The condition of the cervix.  The position of the baby. What are the reasons for labor induction? Labor may be induced for the following reasons:  The health of the baby or mother is at risk.  The pregnancy is overdue by 1 week or more.  The water breaks but labor does not start on its own.  The mother has a health condition or serious illness, such as high blood pressure, infection, placental abruption, or diabetes.  The amniotic fluid amounts are low around the baby.  The baby is distressed. Convenience or wanting the baby to be born on a certain date is not a reason for inducing labor. What methods are used for labor induction? Several methods of labor induction may be used, such as:  Prostaglandin medicine. This medicine causes the cervix to dilate and ripen. The medicine will also start contractions. It can be taken by mouth or by inserting a suppository into the vagina.  Inserting a thin tube (catheter) with a balloon on the end into the vagina to dilate the cervix. Once inserted, the balloon is expanded with water, which causes the cervix to open.  Stripping the membranes. Your health care provider separates  amniotic sac tissue from the cervix, causing the cervix to be stretched and causing the release of a hormone called progesterone. This may cause the uterus to contract. It is often done during an office visit. You will be sent home to wait for the contractions to begin. You will then come in for an induction.  Breaking the water. Your health care provider makes a hole in the amniotic sac using a small instrument. Once the amniotic sac breaks, contractions should begin. This may still take hours to see an effect.  Medicine to trigger or strengthen contractions. This medicine is given through an IV access tube inserted into a vein in your arm. All of the methods of induction, besides stripping the membranes, will be done in the hospital. Induction is done in the hospital so that you and the baby can be carefully monitored. How long does it take for labor to be induced? Some inductions can take up to 2-3 days. Depending on the cervix, it usually takes less time. It takes longer when you are induced early in the pregnancy or if this is your first pregnancy. If a mother is still pregnant and the induction has been going on for 2-3 days, either the mother will be sent home or a cesarean delivery will be needed. What are the risks associated with labor induction? Some of the risks of induction include:  Changes in fetal heart rate, such as too high, too low, or erratic.  Fetal distress.    Chance of infection for the mother and baby.  Increased chance of having a cesarean delivery.  Breaking off (abruption) of the placenta from the uterus (rare).  Uterine rupture (very rare). When induction is needed for medical reasons, the benefits of induction may outweigh the risks. What are some reasons for not inducing labor? Labor induction should not be done if:  It is shown that your baby does not tolerate labor.  You have had previous surgeries on your uterus, such as a myomectomy or the removal of  fibroids.  Your placenta lies very low in the uterus and blocks the opening of the cervix (placenta previa).  Your baby is not in a head-down position.  The umbilical cord drops down into the birth canal in front of the baby. This could cut off the baby's blood and oxygen supply.  You have had a previous cesarean delivery.  There are unusual circumstances, such as the baby being extremely premature. This information is not intended to replace advice given to you by your health care provider. Make sure you discuss any questions you have with your health care provider. Document Released: 06/17/2006 Document Revised: 07/04/2015 Document Reviewed: 08/25/2012 Elsevier Interactive Patient Education  2017 Elsevier Inc.  

## 2017-07-26 NOTE — Progress Notes (Signed)
History and Physical  Cristina KellBethany J Le is a 27 y.o. [id number] 5157w3d  for Induction of Labor scheduled due to Postdates .   See labor record for pregnancy highlights.  No recent pain, bleeding, ruptured membranes, or other signs of progressing labor.  PMHx: She  has a past medical history of Anaphylactic reaction and History of Papanicolaou smear of cervix (01/14/12; 04/11/15). Also,  has a past surgical history that includes No past surgeries., family history includes Breast cancer (age of onset: 28) in her maternal grandmother; Heart disease in her paternal grandfather.,  reports that she has never smoked. She has never used smokeless tobacco. She reports that she does not drink alcohol or use drugs. She has a current medication list which includes the following prescription(s): epinephrine and multivitamin-prenatal. Also, is allergic to fish allergy and fish-derived products. OB History  Gravida Para Term Preterm AB Living  3 2 2  0 0 2  SAB TAB Ectopic Multiple Live Births  0 0 0 0 2    # Outcome Date GA Lbr Len/2nd Weight Sex Delivery Anes PTL Lv  3 Current           2 Term 11/29/15 758w0d  8 lb 7 oz (3.827 kg) M Vag-Spont   LIV  1 Term 08/20/12 3738w0d  8 lb 8 oz (3.856 kg) F Vag-Spont EPI N LIV  Patient denies any other pertinent gynecologic issues.   Review of Systems  Constitutional: Negative for chills, fever and malaise/fatigue.  HENT: Negative for congestion, sinus pain and sore throat.   Eyes: Negative for blurred vision and pain.  Respiratory: Negative for cough and wheezing.   Cardiovascular: Negative for chest pain and leg swelling.  Gastrointestinal: Negative for abdominal pain, constipation, diarrhea, heartburn, nausea and vomiting.  Genitourinary: Negative for dysuria, frequency, hematuria and urgency.  Musculoskeletal: Negative for back pain, joint pain, myalgias and neck pain.  Skin: Negative for itching and rash.  Neurological: Negative for dizziness, tremors and weakness.    Endo/Heme/Allergies: Does not bruise/bleed easily.  Psychiatric/Behavioral: Negative for depression. The patient is not nervous/anxious and does not have insomnia.     Objective: BP 100/60   Wt 178 lb (80.7 kg)   LMP 10/16/2016 (Exact Date)   BMI 27.06 kg/m  Physical Exam  Constitutional: She is oriented to person, place, and time. She appears well-developed and well-nourished. No distress.  Genitourinary: Rectum normal, vagina normal and uterus normal. Pelvic exam was performed with patient supine. There is no rash or lesion on the right labia. There is no rash or lesion on the left labia. Vagina exhibits no lesion. No bleeding in the vagina. Right adnexum does not display mass and does not display tenderness. Left adnexum does not display mass and does not display tenderness. Cervix does not exhibit motion tenderness, lesion, friability or polyp.   Uterus is mobile and midaxial. Uterus is not enlarged or exhibiting a mass.  Genitourinary Comments: Cx 0/80/-3, Vtx  HENT:  Head: Normocephalic and atraumatic. Head is without laceration.  Right Ear: Hearing normal.  Left Ear: Hearing normal.  Nose: No epistaxis.  No foreign bodies.  Mouth/Throat: Uvula is midline, oropharynx is clear and moist and mucous membranes are normal.  Eyes: Pupils are equal, round, and reactive to light.  Neck: Normal range of motion. Neck supple. No thyromegaly present.  Cardiovascular: Normal rate and regular rhythm. Exam reveals no gallop and no friction rub.  No murmur heard. Pulmonary/Chest: Effort normal and breath sounds normal. No respiratory distress. She has  no wheezes. Right breast exhibits no mass, no skin change and no tenderness. Left breast exhibits no mass, no skin change and no tenderness.  Abdominal: Soft. Bowel sounds are normal. She exhibits no distension. There is no tenderness. There is no rebound.  Musculoskeletal: Normal range of motion.  Neurological: She is alert and oriented to person,  place, and time. No cranial nerve deficit.  Skin: Skin is warm and dry.  Psychiatric: She has a normal mood and affect. Judgment normal.  Vitals reviewed.   Assessment: Term Pregnancy for Induction of Labor due to Postdates.  Plan: Patient will undergo induction of labor with written information given.     Patient has been fully informed of the pros and cons, risks and benefits of continued observation with fetal monitoring versus that of induction of labor.   She understands that there are uncommon risks to induction, which include but are not limited to : frequent or prolonged uterine contractions, fetal distress, uterine rupture, and lack of successful induction.  These risks include all methods including Pitocin and Misoprostol and Cervadil.  Patient understands that using Misoprostol for labor induction is an "off label" indication although it has been studied extensively for this purpose and is an accepted method of induction.  She also has been informed of the increased risks for Cesarean with induction and should induction not be successful.  Patient consents to the induction plan of management.  Plans to breast feed Plans bilateral tubal ligation for contraception TDaP declined by pt this pregnancy  Annamarie Major, MD, Cristina Le Ob/Gyn, [facility] 07/26/2017  10:38 AM

## 2017-07-26 NOTE — Telephone Encounter (Signed)
-----   Message from Nadara Mustardobert P Harris, MD sent at 07/25/2017 11:41 AM EDT ----- Regarding: appt Sch pt for NST appt Monday June 17 plz, [place] [facility] [employer] [facility]

## 2017-07-27 ENCOUNTER — Inpatient Hospital Stay
Admission: EM | Admit: 2017-07-27 | Discharge: 2017-08-02 | DRG: 768 | Disposition: A | Discharge status: Home / Self Care | Payer: Medicaid Other | Attending: Obstetrics & Gynecology | Admitting: Obstetrics & Gynecology

## 2017-07-27 ENCOUNTER — Inpatient Hospital Stay: Payer: Medicaid Other | Admitting: Anesthesiology

## 2017-07-27 ENCOUNTER — Other Ambulatory Visit: Payer: Self-pay

## 2017-07-27 DIAGNOSIS — Z3480 Encounter for supervision of other normal pregnancy, unspecified trimester: Secondary | ICD-10-CM

## 2017-07-27 DIAGNOSIS — Z9851 Tubal ligation status: Secondary | ICD-10-CM

## 2017-07-27 DIAGNOSIS — Z3A40 40 weeks gestation of pregnancy: Secondary | ICD-10-CM

## 2017-07-27 DIAGNOSIS — I9581 Postprocedural hypotension: Secondary | ICD-10-CM | POA: Diagnosis not present

## 2017-07-27 DIAGNOSIS — D62 Acute posthemorrhagic anemia: Secondary | ICD-10-CM | POA: Diagnosis not present

## 2017-07-27 DIAGNOSIS — O480 Post-term pregnancy: Secondary | ICD-10-CM | POA: Diagnosis present

## 2017-07-27 DIAGNOSIS — K661 Hemoperitoneum: Secondary | ICD-10-CM | POA: Diagnosis not present

## 2017-07-27 DIAGNOSIS — I959 Hypotension, unspecified: Secondary | ICD-10-CM

## 2017-07-27 DIAGNOSIS — R55 Syncope and collapse: Secondary | ICD-10-CM | POA: Diagnosis present

## 2017-07-27 DIAGNOSIS — Z302 Encounter for sterilization: Secondary | ICD-10-CM

## 2017-07-27 DIAGNOSIS — O9081 Anemia of the puerperium: Secondary | ICD-10-CM | POA: Diagnosis not present

## 2017-07-27 DIAGNOSIS — O9089 Other complications of the puerperium, not elsewhere classified: Secondary | ICD-10-CM | POA: Diagnosis present

## 2017-07-27 DIAGNOSIS — Z9889 Other specified postprocedural states: Secondary | ICD-10-CM

## 2017-07-27 LAB — CBC
HCT: 34.9 % — ABNORMAL LOW (ref 35.0–47.0)
HEMOGLOBIN: 12.2 g/dL (ref 12.0–16.0)
MCH: 32.1 pg (ref 26.0–34.0)
MCHC: 34.9 g/dL (ref 32.0–36.0)
MCV: 91.8 fL (ref 80.0–100.0)
Platelets: 204 10*3/uL (ref 150–440)
RBC: 3.8 MIL/uL (ref 3.80–5.20)
RDW: 14 % (ref 11.5–14.5)
WBC: 10.7 10*3/uL (ref 3.6–11.0)

## 2017-07-27 MED ORDER — PHENYLEPHRINE 40 MCG/ML (10ML) SYRINGE FOR IV PUSH (FOR BLOOD PRESSURE SUPPORT)
80.0000 ug | PREFILLED_SYRINGE | INTRAVENOUS | Status: DC | PRN
  Filled 2017-07-27: qty 5

## 2017-07-27 MED ORDER — LIDOCAINE HCL (PF) 1 % IJ SOLN
INTRAMUSCULAR | Status: AC
  Filled 2017-07-27: qty 30

## 2017-07-27 MED ORDER — LACTATED RINGERS IV SOLN
INTRAVENOUS | Status: DC
  Administered 2017-07-27: 13:00:00 via INTRAVENOUS

## 2017-07-27 MED ORDER — ONDANSETRON HCL 4 MG/2ML IJ SOLN: 4.0000 mg | Freq: Four times a day (QID) | INTRAMUSCULAR | Status: DC | PRN

## 2017-07-27 MED ORDER — OXYTOCIN 40 UNITS IN LACTATED RINGERS INFUSION - SIMPLE MED
2.5000 [IU]/h | INTRAVENOUS | Status: DC
  Filled 2017-07-27: qty 1000

## 2017-07-27 MED ORDER — MISOPROSTOL 25 MCG QUARTER TABLET
25.0000 ug | ORAL_TABLET | ORAL | Status: DC | PRN
  Administered 2017-07-27: 25 ug via VAGINAL
  Filled 2017-07-27: qty 1

## 2017-07-27 MED ORDER — LACTATED RINGERS IV SOLN
500.0000 mL | Freq: Once | INTRAVENOUS | Status: AC
  Administered 2017-07-27: 500 mL via INTRAVENOUS

## 2017-07-27 MED ORDER — DIPHENHYDRAMINE HCL 50 MG/ML IJ SOLN: 12.5000 mg | INTRAMUSCULAR | Status: DC | PRN

## 2017-07-27 MED ORDER — ACETAMINOPHEN 325 MG PO TABS: 650.0000 mg | ORAL_TABLET | ORAL | Status: DC | PRN

## 2017-07-27 MED ORDER — OXYTOCIN 10 UNIT/ML IJ SOLN
INTRAMUSCULAR | Status: AC
  Filled 2017-07-27: qty 2

## 2017-07-27 MED ORDER — SODIUM CHLORIDE FLUSH 0.9 % IV SOLN
INTRAVENOUS | Status: AC
  Filled 2017-07-27: qty 10

## 2017-07-27 MED ORDER — FENTANYL 2.5 MCG/ML W/ROPIVACAINE 0.15% IN NS 100 ML EPIDURAL (ARMC)
EPIDURAL | Status: AC
  Filled 2017-07-27: qty 100

## 2017-07-27 MED ORDER — LACTATED RINGERS IV SOLN: 500.0000 mL | INTRAVENOUS | Status: DC | PRN

## 2017-07-27 MED ORDER — EPHEDRINE 5 MG/ML INJ
10.0000 mg | INTRAVENOUS | Status: DC | PRN
  Filled 2017-07-27: qty 2
  Filled 2017-07-27: qty 4

## 2017-07-27 MED ORDER — BUTORPHANOL TARTRATE 1 MG/ML IJ SOLN
1.0000 mg | INTRAMUSCULAR | Status: DC | PRN
  Administered 2017-07-27: 1 mg via INTRAVENOUS
  Filled 2017-07-27: qty 1

## 2017-07-27 MED ORDER — AMMONIA AROMATIC IN INHA
RESPIRATORY_TRACT | Status: AC
  Filled 2017-07-27: qty 10

## 2017-07-27 MED ORDER — EPHEDRINE 5 MG/ML INJ
10.0000 mg | INTRAVENOUS | Status: DC | PRN
  Administered 2017-07-27: 5 mg via INTRAVENOUS
  Filled 2017-07-27: qty 2

## 2017-07-27 MED ORDER — FENTANYL 2.5 MCG/ML W/ROPIVACAINE 0.15% IN NS 100 ML EPIDURAL (ARMC)
12.0000 mL/h | EPIDURAL | Status: DC
  Administered 2017-07-27: 12 mL/h via EPIDURAL

## 2017-07-27 MED ORDER — OXYTOCIN 40 UNITS IN LACTATED RINGERS INFUSION - SIMPLE MED
1.0000 m[IU]/min | INTRAVENOUS | Status: DC
  Administered 2017-07-27: 2 m[IU]/min via INTRAVENOUS
  Filled 2017-07-27: qty 1000

## 2017-07-27 MED ORDER — OXYTOCIN BOLUS FROM INFUSION: 500.0000 mL | Freq: Once | INTRAVENOUS | Status: DC

## 2017-07-27 MED ORDER — BUPIVACAINE HCL (PF) 0.25 % IJ SOLN
INTRAMUSCULAR | Status: DC | PRN
  Administered 2017-07-27: 5 mL via EPIDURAL

## 2017-07-27 MED ORDER — TERBUTALINE SULFATE 1 MG/ML IJ SOLN: 0.2500 mg | Freq: Once | INTRAMUSCULAR | Status: DC | PRN

## 2017-07-27 MED ORDER — MISOPROSTOL 200 MCG PO TABS
ORAL_TABLET | ORAL | Status: AC
  Filled 2017-07-27: qty 4

## 2017-07-27 NOTE — Discharge Instructions (Signed)
Vaginal Delivery, Care After °Refer to this sheet in the next few weeks. These instructions provide you with information about caring for yourself after vaginal delivery. Your health care provider may also give you more specific instructions. Your treatment has been planned according to current medical practices, but problems sometimes occur. Call your health care provider if you have any problems or questions. °What can I expect after the procedure? °After vaginal delivery, it is common to have: °· Some bleeding from your vagina. °· Soreness in your abdomen, your vagina, and the area of skin between your vaginal opening and your anus (perineum). °· Pelvic cramps. °· Fatigue. ° °Follow these instructions at home: °Medicines °· Take over-the-counter and prescription medicines only as told by your health care provider. °· If you were prescribed an antibiotic medicine, take it as told by your health care provider. Do not stop taking the antibiotic until it is finished. °Driving ° °· Do not drive or operate heavy machinery while taking prescription pain medicine. °· Do not drive for 24 hours if you received a sedative. °Lifestyle °· Do not drink alcohol. This is especially important if you are breastfeeding or taking medicine to relieve pain. °· Do not use tobacco products, including cigarettes, chewing tobacco, or e-cigarettes. If you need help quitting, ask your health care provider. °Eating and drinking °· Drink at least 8 eight-ounce glasses of water every day unless you are told not to by your health care provider. If you choose to breastfeed your baby, you may need to drink more water than this. °· Eat high-fiber foods every day. These foods may help prevent or relieve constipation. High-fiber foods include: °? Whole grain cereals and breads. °? Brown rice. °? Beans. °? Fresh fruits and vegetables. °Activity °· Return to your normal activities as told by your health care provider. Ask your health care provider  what activities are safe for you. °· Rest as much as possible. Try to rest or take a nap when your baby is sleeping. °· Do not lift anything that is heavier than your baby or 10 lb (4.5 kg) until your health care provider says that it is safe. °· Talk with your health care provider about when you can engage in sexual activity. This may depend on your: °? Risk of infection. °? Rate of healing. °? Comfort and desire to engage in sexual activity. °Vaginal Care °· If you have an episiotomy or a vaginal tear, check the area every day for signs of infection. Check for: °? More redness, swelling, or pain. °? More fluid or blood. °? Warmth. °? Pus or a bad smell. °· Do not use tampons or douches until your health care provider says this is safe. °· Watch for any blood clots that may pass from your vagina. These may look like clumps of dark red, brown, or black discharge. °General instructions °· Keep your perineum clean and dry as told by your health care provider. °· Wear loose, comfortable clothing. °· Wipe from front to back when you use the toilet. °· Ask your health care provider if you can shower or take a bath. If you had an episiotomy or a perineal tear during labor and delivery, your health care provider may tell you not to take baths for a certain length of time. °· Wear a bra that supports your breasts and fits you well. °· If possible, have someone help you with household activities and help care for your baby for at least a few days after   you leave the hospital. °· Keep all follow-up visits for you and your baby as told by your health care provider. This is important. °Contact a health care provider if: °· You have: °? Vaginal discharge that has a bad smell. °? Difficulty urinating. °? Pain when urinating. °? A sudden increase or decrease in the frequency of your bowel movements. °? More redness, swelling, or pain around your episiotomy or vaginal tear. °? More fluid or blood coming from your episiotomy or  vaginal tear. °? Pus or a bad smell coming from your episiotomy or vaginal tear. °? A fever. °? A rash. °? Little or no interest in activities you used to enjoy. °? Questions about caring for yourself or your baby. °· Your episiotomy or vaginal tear feels warm to the touch. °· Your episiotomy or vaginal tear is separating or does not appear to be healing. °· Your breasts are painful, hard, or turn red. °· You feel unusually sad or worried. °· You feel nauseous or you vomit. °· You pass large blood clots from your vagina. If you pass a blood clot from your vagina, save it to show to your health care provider. Do not flush blood clots down the toilet without having your health care provider look at them. °· You urinate more than usual. °· You are dizzy or light-headed. °· You have not breastfed at all and you have not had a menstrual period for 12 weeks after delivery. °· You have stopped breastfeeding and you have not had a menstrual period for 12 weeks after you stopped breastfeeding. °Get help right away if: °· You have: °? Pain that does not go away or does not get better with medicine. °? Chest pain. °? Difficulty breathing. °? Blurred vision or spots in your vision. °? Thoughts about hurting yourself or your baby. °· You develop pain in your abdomen or in one of your legs. °· You develop a severe headache. °· You faint. °· You bleed from your vagina so much that you fill two sanitary pads in one hour. °This information is not intended to replace advice given to you by your health care provider. Make sure you discuss any questions you have with your health care provider. °Document Released: 01/24/2000 Document Revised: 07/10/2015 Document Reviewed: 02/10/2015 °Elsevier Interactive Patient Education © 2018 Elsevier Inc. ° °

## 2017-07-27 NOTE — Progress Notes (Signed)
  Labor Progress Note   27 y.o. [id number] @ 935w4d , admitted for  Pregnancy, Labor Management.   Subjective:  Min pain Has received one dose Cytotec  Objective:  BP 105/65 (BP Location: Left Arm)   Pulse (!) 102   Temp 98.4 F (36.9 C) (Oral)   Resp 16   Ht 5\' 8"  (1.727 m)   Wt 177 lb (80.3 kg)   LMP 10/16/2016 (Exact Date)   SpO2 100%   BMI 26.91 kg/m  Abd: gravid NT ND Extr: trace to 1+ bilateral pedal edema SVE: CERVIX: 1/80/-3 (small change)  EFM: FHR: 140 bpm, variability: moderate,  accelerations:  Present,  decelerations:  Absent Toco: Frequency: Every 5-7 minutes Labs: I have reviewed the patient's lab results.   Assessment & Plan:  [id number] @ 6735w4d, admitted for  Pregnancy and Labor/Delivery Management  1. Pain management: none. 2. FWB: FHT category 1.  3. ID: GBS negative 4. Labor management: Pitocin now.  Monitor for decels related to ctxs.  Risks of placental insufficency due to post dates discussed.  CS possibilities discussed.  All discussed with patient, see orders  Annamarie MajorPaul Kippy Gohman, MD, Merlinda FrederickFACOG [facility], [facility] 07/27/2017  5:03 PM

## 2017-07-27 NOTE — Discharge Summary (Addendum)
OB Discharge Summary     Patient Name: Cristina Le DOB: 09-Jun-1990 MRN: [id number]  Date of admission: 07/27/2017 Delivering MD: Vena Austria, MD  Date of Delivery: 07/27/2017  Date of discharge: 08/02/2017  Admitting diagnosis: 40W Intrauterine pregnancy: [redacted]w[redacted]d     Secondary diagnosis: None     Discharge diagnosis: Term Pregnancy Delivered, Post dates and [name]; Infant right hand deformity, postpartum tubal ligation, hemoperitoneum requiring X-lap with ligation of bleeding on right tube                        Hospital course:  Onset of Labor With Vaginal Delivery     27 y.o. yo [id number] at [redacted]w[redacted]d was admitted in Active Labor on 07/27/2017. Patient had an uncomplicated labor course as follows:  Membrane Rupture Time/Date: 10:25 PM ,07/27/2017   Intrapartum Procedures: Episiotomy: None [1]                                         Lacerations:  None [1]  Patient had a delivery of a Viable infant. 07/27/2017  Information for the patient's newborn:  Luara, Faye [[id number]]  Delivery Method: Vag-Spont    Patient had an uncomplicated postpartum course, until postpartum day 2.  The patient underwent postpartum tubal ligation and postpartum day 2 via Pomeroy method.  Postoperatively the patient had difficult to control abdominal pain.  She was noted to be hypotensive, slowed mental status, and shallow respirations, with normal pulse in 70-80's.  Suspicion was for opioid sensitivity and she was treated with Narcan, IV fluid bolus with initial response and improvement in symptoms.  Repeat CBC obtained and noted to be 10.6 on Hgb which was deemed an appropriate drop from 12.6 4-hr post delivery for someone two days postpartum.   As bleeding remained in the differential the patient was also crossmatched for 2 units of pRBC.  CBC repeated 1-hr later and Hgb dropped to 9.5, she also failed to have the same response to Narcane as she did with prior doses.  An acute limited abdominal ultrasound  was ordered to evaluate for free fluid which did show complex fluid around the pelvis and liver consistent with hemoperitoneum.  Pulse remained in the 70-80's.  Decision was made to proceed to the OR for re-exploration.  The infraumbilical hernia was re-opened, hemoperitoneum was confirmed and a pfanenstiel incison was utilized to gain access the the uterus and tubes.  The left tube was intact and hemostatic.  The right tubal ties were present on the distal tube but the right tube and torn or slipped out of the tie.  This are was over sown, before evacuating hemoperitoneum.  The patient received 2 units of pRBC intraoperatively.  She remained hemodynamically stable for the remainder of her admission.  She is ambulating, tolerating a regular diet, passing flatus, and urinating well. Patient is discharged home in stable condition on 08/02/17.  Prior to discharge on POD3 patient did additional ambulation and felt light headed and weakness.  Patient was given 1 additional unit of pRBC with Hgb of 9.4 post transfusion and resolution of symptoms.  Post partum procedures:postpartum tubal ligation, exploratory laparotomy  Complications: Postoperative hemorrhage  Physical exam on 08/02/2017: Vitals:   08/01/17 1816 08/01/17 2043 08/01/17 2043 08/01/17 2351  BP: 101/72  111/71 108/76  Pulse: 92  86 79  Resp: 16  18 16   Temp: 98.8 F (37.1 C) 98.8 F (37.1 C) 98.8 F (37.1 C) 98.6 F (37 C)  TempSrc: Oral Oral Oral Oral  SpO2: 98%  99% 98%  Weight:      Height:      appropriate General: alert Lochia: moderate Uterine Fundus: firm Incision: Healing well with no significant drainage DVT Evaluation: No evidence of DVT seen on physical exam.  Labs: Lab Results  Component Value Date   WBC 9.5 08/01/2017   HGB 9.4 (L) 08/01/2017   HCT 27.0 (L) 08/01/2017   MCV 91.5 08/01/2017   PLT 217 08/01/2017   CMP Latest Ref Rng & Units  01/01/2017  Glucose 65 - 99 mg/dL 161(W)  BUN 6 - 20 mg/dL 18  Creatinine 9.60 - 4.54 mg/dL 0.98  Sodium 119 - 147 mmol/L 136  Potassium 3.5 - 5.1 mmol/L 3.8  Chloride 101 - 111 mmol/L 104  CO2 22 - 32 mmol/L 22  Calcium 8.9 - 10.3 mg/dL 9.1  Total Protein 6.5 - 8.1 g/dL 7.1  Total Bilirubin 0.3 - 1.2 mg/dL 0.4  Alkaline Phos 38 - 126 U/L 36(L)  AST 15 - 41 U/L 16  ALT 14 - 54 U/L 11(L)    Discharge instruction: per After Visit Summary.  Medications:  Allergies as of 08/02/2017      Reactions   Fish Allergy Anaphylaxis   Fish-derived Products Anaphylaxis      Medication List    STOP taking these medications   multivitamin-prenatal 27-0.8 MG Tabs tablet     TAKE these medications   EPIPEN 2-PAK 0.3 mg/0.3 mL Soaj injection Generic drug:  EPINEPHrine 1 IM INJECTION AS NEEDED   FUSION 65-65-25-30 MG Caps Take 1 tablet by mouth daily.   ibuprofen 600 MG tablet Commonly known as:  ADVIL,MOTRIN Take 1 tablet (600 mg total) by mouth every 6 (six) hours.   oxyCODONE-acetaminophen 5-325 MG tablet Commonly known as:  PERCOCET/ROXICET Take 1 tablet by mouth every 4 (four) hours as needed (pain scale 4-7).            Discharge Care Instructions  (From admission, onward)        Start     Ordered   08/01/17 0000  Discharge wound care:    Comments:  You may apply a light dressing for minor discharge from the incision or to keep waistbands of clothing from rubbing.  You may also have been discharge with a clear dressing in which case this will be removed at your postoperative clinic visit.  You may shower, use soap on your incision.  Avoid baths or soaking the incision in the first 6 weeks following your surgery.Marland Kitchen   08/01/17 0735   07/29/17 0000  Discharge wound care:    Comments:  You may apply a light dressing for minor discharge from the incision or to keep waistbands of clothing from rubbing.  You may also have been discharge with a clear dressing in which case this  will be removed at your postoperative clinic visit.  You may shower, use soap on your incision.  Avoid baths or soaking the incision in the first 6 weeks following your surgery.Marland Kitchen   07/29/17 1539      Diet: routine diet  Activity:  Advance as tolerated. Pelvic rest for 6 weeks.   Outpatient follow up: Follow-up Information    Vena AustriaStaebler, Payge Eppes, MD In 1 week.   Specialty:  Obstetrics and Gynecology Why:  For wound re-check Contact information: [address] [place] [address] [phone]             Postpartum contraception: Tubal Ligation Rhogam Given postpartum: no Rubella vaccine given postpartum: no Varicella vaccine given postpartum: no TDaP given antepartum or postpartum: No  Newborn Data: Live born child  Birth Weight: 6 lb 13.4 oz (3100 g) APGAR: 8, 9  Newborn Delivery   Birth date/time:  07/27/2017 22:39:00 Delivery type:  Vaginal, Spontaneous      Baby Feeding: Breast  Disposition:home with mother  SIGNED: Vena AustriaAndreas Malana Eberwein, MD 08/02/2017 7:31 AM

## 2017-07-27 NOTE — Plan of Care (Signed)
Pt presents for induction of labor post dates in L&D. Admission completed. Discussed methods of induction with pt and the medications given to her. Discussed labor and delivery unit and the labor process. Pt verbalized understanding.

## 2017-07-27 NOTE — Anesthesia Procedure Notes (Signed)
Epidural Patient location during procedure: OB  Staffing Anesthesiologist: Steffany Schoenfelder, MD Performed: anesthesiologist   Preanesthetic Checklist Completed: patient identified, site marked, surgical consent, pre-op evaluation, timeout performed, IV checked, risks and benefits discussed and monitors and equipment checked  Epidural Patient position: sitting Prep: ChloraPrep Patient monitoring: heart rate, continuous pulse ox and blood pressure Approach: midline Location: L4-L5 Injection technique: LOR saline  Needle:  Needle type: Tuohy  Needle gauge: 18 G Needle length: 9 cm and 9 Catheter type: closed end flexible Catheter size: 20 Guage Test dose: negative and 1.5% lidocaine with Epi 1:200 K  Assessment Sensory level: T10 Events: blood not aspirated, injection not painful, no injection resistance, negative IV test and no paresthesia  Additional Notes   Patient tolerated the insertion well without complications.Reason for block:procedure for pain     

## 2017-07-27 NOTE — H&P (Signed)
History and Physical Interval Note:  07/27/2017 1:18 PM  Cristina Le  has presented today for INDUCTION OF LABOR (cervical ripening agents),  with the diagnosis of Postdates. The various methods of treatment have been discussed with the patient and family. After consideration of risks, benefits and other options for treatment, the patient has consented to  Labor induction .  The patient's history has been reviewed, patient examined, no change in status, and is stable for induction as planned.  See H&P. I have reviewed the patient's chart and labs.  Questions were answered to the patient's satisfaction.    Exam unchanged. Cx 0/60/-3. Cytotec placed. A NST procedure was performed with FHR monitoring and a normal baseline established, appropriate time of 20-40 minutes of evaluation, and accels >2 seen w 15x15 characteristics.  Results show a REACTIVE NST.   Plans PP BTL.  Annamarie MajorPaul Harris, MD, Merlinda FrederickFACOG [facility], [facility] 07/27/2017  1:18 PM

## 2017-07-27 NOTE — Anesthesia Preprocedure Evaluation (Signed)
Anesthesia Evaluation  Patient identified by MRN, date of birth, ID band Patient awake    Reviewed: Allergy & Precautions, NPO status , Patient's Chart, lab work & pertinent test results, reviewed documented beta blocker date and time   Airway Mallampati: II  TM Distance: >3 FB     Dental  (+) Chipped   Pulmonary           Cardiovascular      Neuro/Psych    GI/Hepatic   Endo/Other    Renal/GU      Musculoskeletal   Abdominal   Peds  Hematology   Anesthesia Other Findings   Reproductive/Obstetrics                             Anesthesia Physical Anesthesia Plan  ASA: II  Anesthesia Plan: Epidural   Post-op Pain Management:    Induction:   PONV Risk Score and Plan:   Airway Management Planned:   Additional Equipment:   Intra-op Plan:   Post-operative Plan:   Informed Consent: I have reviewed the patients History and Physical, chart, labs and discussed the procedure including the risks, benefits and alternatives for the proposed anesthesia with the patient or authorized representative who has indicated his/her understanding and acceptance.     Plan Discussed with: CRNA  Anesthesia Plan Comments:         Anesthesia Quick Evaluation  

## 2017-07-28 LAB — CBC
HEMATOCRIT: 36.5 % (ref 35.0–47.0)
HEMOGLOBIN: 12.6 g/dL (ref 12.0–16.0)
MCH: 31.8 pg (ref 26.0–34.0)
MCHC: 34.5 g/dL (ref 32.0–36.0)
MCV: 92.1 fL (ref 80.0–100.0)
Platelets: 192 10*3/uL (ref 150–440)
RBC: 3.96 MIL/uL (ref 3.80–5.20)
RDW: 13.8 % (ref 11.5–14.5)
WBC: 15.3 10*3/uL — AB (ref 3.6–11.0)

## 2017-07-28 LAB — RPR: RPR Ser Ql: NONREACTIVE

## 2017-07-28 MED ORDER — OXYCODONE-ACETAMINOPHEN 5-325 MG PO TABS: 1.0000 | ORAL_TABLET | ORAL | Status: DC | PRN

## 2017-07-28 MED ORDER — COCONUT OIL OIL: 1.0000 "application " | TOPICAL_OIL | Status: DC | PRN

## 2017-07-28 MED ORDER — OXYCODONE-ACETAMINOPHEN 5-325 MG PO TABS: 2.0000 | ORAL_TABLET | ORAL | Status: DC | PRN

## 2017-07-28 MED ORDER — SODIUM CHLORIDE 0.9% FLUSH: 3.0000 mL | INTRAVENOUS | Status: DC | PRN

## 2017-07-28 MED ORDER — SODIUM CHLORIDE 0.9 % IV SOLN
250.0000 mL | INTRAVENOUS | Status: DC | PRN
  Administered 2017-07-29 – 2017-07-30 (×2): via INTRAVENOUS

## 2017-07-28 MED ORDER — SENNOSIDES-DOCUSATE SODIUM 8.6-50 MG PO TABS
2.0000 | ORAL_TABLET | ORAL | Status: DC
  Administered 2017-07-28: 2 via ORAL
  Filled 2017-07-28: qty 2

## 2017-07-28 MED ORDER — BENZOCAINE-MENTHOL 20-0.5 % EX AERO: 1.0000 "application " | INHALATION_SPRAY | CUTANEOUS | Status: DC | PRN

## 2017-07-28 MED ORDER — IBUPROFEN 600 MG PO TABS
600.0000 mg | ORAL_TABLET | Freq: Four times a day (QID) | ORAL | Status: DC
  Administered 2017-07-28 – 2017-07-29 (×4): 600 mg via ORAL
  Filled 2017-07-28 (×4): qty 1

## 2017-07-28 MED ORDER — IBUPROFEN 600 MG PO TABS
600.0000 mg | ORAL_TABLET | Freq: Four times a day (QID) | ORAL | Status: DC
  Administered 2017-07-28: 600 mg via ORAL
  Filled 2017-07-28: qty 1

## 2017-07-28 MED ORDER — WITCH HAZEL-GLYCERIN EX PADS: 1.0000 "application " | MEDICATED_PAD | CUTANEOUS | Status: DC | PRN

## 2017-07-28 MED ORDER — DIPHENHYDRAMINE HCL 25 MG PO CAPS: 25.0000 mg | ORAL_CAPSULE | Freq: Four times a day (QID) | ORAL | Status: DC | PRN

## 2017-07-28 MED ORDER — ZOLPIDEM TARTRATE 5 MG PO TABS: 5.0000 mg | ORAL_TABLET | Freq: Every evening | ORAL | Status: DC | PRN

## 2017-07-28 MED ORDER — SODIUM CHLORIDE 0.9% FLUSH
3.0000 mL | Freq: Two times a day (BID) | INTRAVENOUS | Status: DC
  Administered 2017-07-28 – 2017-07-29 (×3): 3 mL via INTRAVENOUS

## 2017-07-28 MED ORDER — ACETAMINOPHEN 325 MG PO TABS
650.0000 mg | ORAL_TABLET | ORAL | Status: DC | PRN
  Administered 2017-07-28: 650 mg via ORAL
  Filled 2017-07-28: qty 2

## 2017-07-28 MED ORDER — ONDANSETRON HCL 4 MG PO TABS: 4.0000 mg | ORAL_TABLET | ORAL | Status: DC | PRN

## 2017-07-28 MED ORDER — ONDANSETRON HCL 4 MG/2ML IJ SOLN: 4.0000 mg | INTRAMUSCULAR | Status: DC | PRN

## 2017-07-28 MED ORDER — DIBUCAINE 1 % RE OINT: 1.0000 "application " | TOPICAL_OINTMENT | RECTAL | Status: DC | PRN

## 2017-07-28 MED ORDER — SIMETHICONE 80 MG PO CHEW: 80.0000 mg | CHEWABLE_TABLET | ORAL | Status: DC | PRN

## 2017-07-28 NOTE — Anesthesia Postprocedure Evaluation (Signed)
Anesthesia Post Note  Patient: Allen KellBethany J Fultz  Procedure(s) Performed: AN AD HOC LABOR EPIDURAL  Patient location during evaluation: Mother Baby Anesthesia Type: Epidural Level of consciousness: awake, awake and alert and oriented Pain management: pain level controlled Vital Signs Assessment: post-procedure vital signs reviewed and stable Respiratory status: spontaneous breathing, nonlabored ventilation and respiratory function stable Cardiovascular status: blood pressure returned to baseline and stable Postop Assessment: no headache and no backache Anesthetic complications: no     Last Vitals:  Vitals:   07/28/17 0310 07/28/17 0730  BP: 112/74 100/70  Pulse: 82 83  Resp: 18 18  Temp: 36.6 C 36.7 C  SpO2: 97% 97%    Last Pain:  Vitals:   07/28/17 0730  TempSrc: Oral  PainSc:                  Ginger CarneStephanie Laylani Pudwill

## 2017-07-28 NOTE — Plan of Care (Signed)
Reviewed plan of care with patient. All questions answered. 

## 2017-07-28 NOTE — Progress Notes (Signed)
PPD#1 SVD Subjective:  Sitting up in bed feeding baby. Pain control is good. Voiding without difficulty. Tolerating a regular diet. Ambulating well.  Objective:   Blood pressure 103/65, pulse 74, temperature 98 F (36.7 C), temperature source Oral, resp. rate 18, height 5\' 8"  (1.727 m), weight 177 lb (80.3 kg), last menstrual period 10/16/2016, SpO2 97 %, currently breastfeeding.  General: NAD Pulmonary: no increased work of breathing Abdomen: non-distended, non-tender Uterus:  fundus firm; lochia appropriate Laceration: N/A Extremities: no edema, no erythema, no tenderness, no signs of DVT  Results for orders placed or performed during the hospital encounter of 07/27/17 (from the past 72 hour(s))  CBC     Status: Abnormal   Collection Time: 07/27/17 12:50 PM  Result Value Ref Range   WBC 10.7 3.6 - 11.0 K/uL   RBC 3.80 3.80 - 5.20 MIL/uL   Hemoglobin 12.2 12.0 - 16.0 g/dL   HCT 01.034.9 (L) 27.235.0 - 53.647.0 %   MCV 91.8 80.0 - 100.0 fL   MCH 32.1 26.0 - 34.0 pg   MCHC 34.9 32.0 - 36.0 g/dL   RDW 64.414.0 03.411.5 - 74.214.5 %   Platelets 204 150 - 440 K/uL    Comment: Performed at [facility], [address] [place], [place] [address]  RPR     Status: None   Collection Time: 07/27/17 12:50 PM  Result Value Ref Range   RPR Ser Ql Non Reactive Non Reactive    Comment: (NOTE) Performed At: [facility] [address] [place], [place] [id number] Jolene SchimkeNagendra Sanjai MD [phone] Performed at [facility], [address] [place], [place] [address]   Type and screen     Status: None   Collection Time: 07/27/17 12:50 PM  Result Value Ref Range   ABO/RH(D) A POS    Antibody Screen NEG    Sample Expiration      07/30/2017 Performed at [facility], [address] [place], [place] [address]   CBC     Status: Abnormal   Collection Time: 07/28/17  4:57 AM  Result Value Ref Range   WBC 15.3 (H) 3.6 - 11.0 K/uL   RBC 3.96 3.80 - 5.20 MIL/uL   Hemoglobin  12.6 12.0 - 16.0 g/dL   HCT 57.336.5 22.035.0 - 25.447.0 %   MCV 92.1 80.0 - 100.0 fL   MCH 31.8 26.0 - 34.0 pg   MCHC 34.5 32.0 - 36.0 g/dL   RDW 27.013.8 62.311.5 - 76.214.5 %   Platelets 192 150 - 440 K/uL    Comment: Performed at [facility], [address] [place], [place] [address]    Assessment:   27 y.o. [id number] postpartum day # 1 in good condition.  Plan:   1) Acute blood loss anemia - hemodynamically stable and asymptomatic.  2) Blood Type --/--/A POS (06/18 1250) /   3) Rubella 1.89 (11/27 1538) / Varicella immune /TDAP status: declined 05/27/17  4) Breast feeding  5) Contraception: BTL  6) Disposition: continue postpartum care.   Marcelyn BruinsJacelyn Maleiya Pergola, CNM 07/28/2017

## 2017-07-29 ENCOUNTER — Encounter
Admission: EM | Disposition: A | Discharge status: Home / Self Care | Payer: Self-pay | Source: Home / Self Care | Attending: Obstetrics & Gynecology

## 2017-07-29 ENCOUNTER — Encounter: Payer: Self-pay | Admitting: *Deleted

## 2017-07-29 ENCOUNTER — Inpatient Hospital Stay: Payer: Medicaid Other | Admitting: Anesthesiology

## 2017-07-29 ENCOUNTER — Inpatient Hospital Stay: Payer: Medicaid Other

## 2017-07-29 DIAGNOSIS — Z302 Encounter for sterilization: Secondary | ICD-10-CM

## 2017-07-29 HISTORY — PX: TUBAL LIGATION: SHX77

## 2017-07-29 HISTORY — PX: LAPAROTOMY: SHX154

## 2017-07-29 LAB — CBC
HCT: 28.1 % — ABNORMAL LOW (ref 35.0–47.0)
HEMATOCRIT: 32.2 % — AB (ref 35.0–47.0)
HEMOGLOBIN: 10.6 g/dL — AB (ref 12.0–16.0)
HEMOGLOBIN: 9.5 g/dL — AB (ref 12.0–16.0)
MCH: 31 pg (ref 26.0–34.0)
MCH: 31.7 pg (ref 26.0–34.0)
MCHC: 32.8 g/dL (ref 32.0–36.0)
MCHC: 34 g/dL (ref 32.0–36.0)
MCV: 94.3 fL (ref 80.0–100.0)
MCV: 93.3 fL (ref 80.0–100.0)
PLATELETS: 230 10*3/uL (ref 150–440)
Platelets: 223 10*3/uL (ref 150–440)
RBC: 3.42 MIL/uL — ABNORMAL LOW (ref 3.80–5.20)
RBC: 3.01 MIL/uL — ABNORMAL LOW (ref 3.80–5.20)
RDW: 14.2 % (ref 11.5–14.5)
RDW: 14.1 % (ref 11.5–14.5)
WBC: 19.3 10*3/uL — ABNORMAL HIGH (ref 3.6–11.0)
WBC: 17.6 10*3/uL — ABNORMAL HIGH (ref 3.6–11.0)

## 2017-07-29 LAB — PREPARE RBC (CROSSMATCH)

## 2017-07-29 SURGERY — LAPAROTOMY, EXPLORATORY
Anesthesia: General | Site: Abdomen | Wound class: Clean Contaminated

## 2017-07-29 SURGERY — LIGATION, FALLOPIAN TUBE, POSTPARTUM
Anesthesia: General

## 2017-07-29 MED ORDER — LIDOCAINE HCL (CARDIAC) PF 100 MG/5ML IV SOSY
PREFILLED_SYRINGE | INTRAVENOUS | Status: DC | PRN
  Administered 2017-07-29: 60 mg via INTRAVENOUS

## 2017-07-29 MED ORDER — PROPOFOL 10 MG/ML IV BOLUS
INTRAVENOUS | Status: DC | PRN
  Administered 2017-07-29: 150 mg via INTRAVENOUS

## 2017-07-29 MED ORDER — ACETAMINOPHEN NICU IV SYRINGE 10 MG/ML
INTRAVENOUS | Status: AC
  Filled 2017-07-29: qty 1

## 2017-07-29 MED ORDER — PROPOFOL 10 MG/ML IV BOLUS
INTRAVENOUS | Status: AC
  Filled 2017-07-29: qty 20

## 2017-07-29 MED ORDER — OXYCODONE HCL 5 MG PO TABS: 5.0000 mg | ORAL_TABLET | Freq: Once | ORAL | Status: DC | PRN

## 2017-07-29 MED ORDER — SODIUM CHLORIDE 0.9% IV SOLUTION: Freq: Once | INTRAVENOUS | Status: DC

## 2017-07-29 MED ORDER — CEFAZOLIN SODIUM-DEXTROSE 2-4 GM/100ML-% IV SOLN
2.0000 g | INTRAVENOUS | Status: AC
  Administered 2017-07-29: 2 g via INTRAVENOUS
  Filled 2017-07-29: qty 100

## 2017-07-29 MED ORDER — DEXAMETHASONE SODIUM PHOSPHATE 10 MG/ML IJ SOLN
INTRAMUSCULAR | Status: AC
  Filled 2017-07-29: qty 1

## 2017-07-29 MED ORDER — KETOROLAC TROMETHAMINE 30 MG/ML IJ SOLN
INTRAMUSCULAR | Status: DC | PRN
  Administered 2017-07-29: 30 mg via INTRAVENOUS

## 2017-07-29 MED ORDER — ONDANSETRON HCL 4 MG/2ML IJ SOLN
INTRAMUSCULAR | Status: DC | PRN
  Administered 2017-07-29: 4 mg via INTRAVENOUS

## 2017-07-29 MED ORDER — LIDOCAINE HCL (PF) 2 % IJ SOLN
INTRAMUSCULAR | Status: AC
  Filled 2017-07-29: qty 10

## 2017-07-29 MED ORDER — IBUPROFEN 600 MG PO TABS: 600.0000 mg | ORAL_TABLET | Freq: Four times a day (QID) | ORAL | 0 refills | Status: DC

## 2017-07-29 MED ORDER — FENTANYL CITRATE (PF) 100 MCG/2ML IJ SOLN
INTRAMUSCULAR | Status: AC
  Filled 2017-07-29: qty 2

## 2017-07-29 MED ORDER — KETOROLAC TROMETHAMINE 30 MG/ML IJ SOLN
INTRAMUSCULAR | Status: AC
  Filled 2017-07-29: qty 1

## 2017-07-29 MED ORDER — FLUMAZENIL 0.5 MG/5ML IV SOLN
0.2000 mg | Freq: Once | INTRAVENOUS | Status: AC
  Administered 2017-07-29: 0.2 mg via INTRAVENOUS
  Filled 2017-07-29: qty 5

## 2017-07-29 MED ORDER — PHENYLEPHRINE HCL 10 MG/ML IJ SOLN
INTRAMUSCULAR | Status: DC | PRN
  Administered 2017-07-29: 200 ug via INTRAVENOUS
  Administered 2017-07-29 – 2017-07-30 (×2): 100 ug via INTRAVENOUS

## 2017-07-29 MED ORDER — ROCURONIUM BROMIDE 100 MG/10ML IV SOLN
INTRAVENOUS | Status: DC | PRN
  Administered 2017-07-29: 40 mg via INTRAVENOUS

## 2017-07-29 MED ORDER — DEXAMETHASONE SODIUM PHOSPHATE 10 MG/ML IJ SOLN
INTRAMUSCULAR | Status: DC | PRN
  Administered 2017-07-29: 10 mg via INTRAVENOUS

## 2017-07-29 MED ORDER — SUGAMMADEX SODIUM 200 MG/2ML IV SOLN
INTRAVENOUS | Status: DC | PRN
  Administered 2017-07-29: 200 mg via INTRAVENOUS

## 2017-07-29 MED ORDER — FENTANYL CITRATE (PF) 100 MCG/2ML IJ SOLN
INTRAMUSCULAR | Status: DC | PRN
  Administered 2017-07-29 (×4): 50 ug via INTRAVENOUS

## 2017-07-29 MED ORDER — KETOROLAC TROMETHAMINE 30 MG/ML IJ SOLN
30.0000 mg | Freq: Four times a day (QID) | INTRAMUSCULAR | Status: DC | PRN
  Administered 2017-07-29: 30 mg via INTRAVENOUS
  Filled 2017-07-29: qty 1

## 2017-07-29 MED ORDER — HYDROMORPHONE HCL 1 MG/ML IJ SOLN
INTRAMUSCULAR | Status: AC
  Administered 2017-07-29: 0.5 mg via INTRAVENOUS
  Filled 2017-07-29: qty 1

## 2017-07-29 MED ORDER — BUPIVACAINE HCL (PF) 0.5 % IJ SOLN
INTRAMUSCULAR | Status: AC
  Filled 2017-07-29: qty 30

## 2017-07-29 MED ORDER — FENTANYL CITRATE (PF) 100 MCG/2ML IJ SOLN
INTRAMUSCULAR | Status: AC
  Administered 2017-07-29: 25 ug via INTRAVENOUS
  Filled 2017-07-29: qty 2

## 2017-07-29 MED ORDER — NALOXONE HCL 0.4 MG/ML IJ SOLN
INTRAMUSCULAR | Status: AC
  Filled 2017-07-29: qty 1

## 2017-07-29 MED ORDER — PROPOFOL 10 MG/ML IV BOLUS
INTRAVENOUS | Status: DC | PRN
  Administered 2017-07-29: 100 mg via INTRAVENOUS

## 2017-07-29 MED ORDER — LACTATED RINGERS IV SOLN
INTRAVENOUS | Status: DC | PRN
  Administered 2017-07-29: 14:00:00 via INTRAVENOUS

## 2017-07-29 MED ORDER — HYDROMORPHONE HCL 1 MG/ML IJ SOLN
0.2500 mg | INTRAMUSCULAR | Status: DC | PRN
  Administered 2017-07-29 (×2): 0.5 mg via INTRAVENOUS

## 2017-07-29 MED ORDER — AMMONIA AROMATIC IN INHA
RESPIRATORY_TRACT | Status: AC
  Filled 2017-07-29: qty 10

## 2017-07-29 MED ORDER — OXYCODONE-ACETAMINOPHEN 5-325 MG PO TABS: 1.0000 | ORAL_TABLET | ORAL | 0 refills | Status: DC | PRN

## 2017-07-29 MED ORDER — ACETAMINOPHEN 10 MG/ML IV SOLN
INTRAVENOUS | Status: DC | PRN
  Administered 2017-07-29: 1000 mg via INTRAVENOUS

## 2017-07-29 MED ORDER — ROCURONIUM BROMIDE 50 MG/5ML IV SOLN
INTRAVENOUS | Status: AC
  Filled 2017-07-29: qty 1

## 2017-07-29 MED ORDER — OXYCODONE HCL 5 MG/5ML PO SOLN: 5.0000 mg | Freq: Once | ORAL | Status: DC | PRN

## 2017-07-29 MED ORDER — GLYCOPYRROLATE 0.2 MG/ML IJ SOLN
INTRAMUSCULAR | Status: DC | PRN
  Administered 2017-07-29: 0.1 mg via INTRAVENOUS

## 2017-07-29 MED ORDER — HYDROMORPHONE HCL 1 MG/ML IJ SOLN
INTRAMUSCULAR | Status: AC
  Filled 2017-07-29: qty 1

## 2017-07-29 MED ORDER — NALOXONE HCL 0.4 MG/ML IJ SOLN
0.4000 mg | INTRAMUSCULAR | Status: DC | PRN
  Administered 2017-07-29 (×6): 0.4 mg via INTRAVENOUS
  Filled 2017-07-29 (×4): qty 1

## 2017-07-29 MED ORDER — ONDANSETRON HCL 4 MG/2ML IJ SOLN
INTRAMUSCULAR | Status: AC
  Filled 2017-07-29: qty 2

## 2017-07-29 MED ORDER — ROCURONIUM BROMIDE 100 MG/10ML IV SOLN
INTRAVENOUS | Status: DC | PRN
  Administered 2017-07-29: 30 mg via INTRAVENOUS

## 2017-07-29 MED ORDER — HYDROMORPHONE HCL 1 MG/ML IJ SOLN
INTRAMUSCULAR | Status: DC | PRN
  Administered 2017-07-29: 1 mg via INTRAVENOUS

## 2017-07-29 MED ORDER — BUPIVACAINE 0.25 % ON-Q PUMP DUAL CATH 400 ML
400.0000 mL | INJECTION | Status: DC
  Administered 2017-07-30: 400 mL
  Filled 2017-07-29: qty 400

## 2017-07-29 MED ORDER — GLYCOPYRROLATE 0.2 MG/ML IJ SOLN
INTRAMUSCULAR | Status: AC
  Filled 2017-07-29: qty 1

## 2017-07-29 MED ORDER — FENTANYL CITRATE (PF) 100 MCG/2ML IJ SOLN
25.0000 ug | INTRAMUSCULAR | Status: DC | PRN
  Administered 2017-07-29: 50 ug via INTRAVENOUS
  Administered 2017-07-29 (×2): 25 ug via INTRAVENOUS
  Administered 2017-07-29 (×2): 50 ug via INTRAVENOUS

## 2017-07-29 MED ORDER — FENTANYL CITRATE (PF) 100 MCG/2ML IJ SOLN
INTRAMUSCULAR | Status: AC
  Administered 2017-07-29: 50 ug via INTRAVENOUS
  Filled 2017-07-29: qty 2

## 2017-07-29 MED ORDER — SUGAMMADEX SODIUM 200 MG/2ML IV SOLN
INTRAVENOUS | Status: AC
  Filled 2017-07-29: qty 2

## 2017-07-29 MED ORDER — DEXAMETHASONE SODIUM PHOSPHATE 10 MG/ML IJ SOLN
INTRAMUSCULAR | Status: DC | PRN
  Administered 2017-07-29: 5 mg via INTRAVENOUS

## 2017-07-29 MED ORDER — MIDAZOLAM HCL 2 MG/2ML IJ SOLN
INTRAMUSCULAR | Status: DC | PRN
  Administered 2017-07-29: 2 mg via INTRAVENOUS
  Administered 2017-07-29: 3 mg via INTRAVENOUS

## 2017-07-29 MED ORDER — ALBUMIN HUMAN 5 % IV SOLN
INTRAVENOUS | Status: AC
  Filled 2017-07-29: qty 250

## 2017-07-29 SURGICAL SUPPLY — 26 items
CHLORAPREP W/TINT 26ML (MISCELLANEOUS) ×3 IMPLANT
DERMABOND ADVANCED (GAUZE/BANDAGES/DRESSINGS) ×2
DERMABOND ADVANCED .7 DNX12 (GAUZE/BANDAGES/DRESSINGS) ×1 IMPLANT
DRAPE LAPAROTOMY 100X77 ABD (DRAPES) ×3 IMPLANT
DRSG TEGADERM 2-3/8X2-3/4 SM (GAUZE/BANDAGES/DRESSINGS) ×3 IMPLANT
DRSG TELFA 4X3 1S NADH ST (GAUZE/BANDAGES/DRESSINGS) ×3 IMPLANT
ELECT REM PT RETURN 9FT ADLT (ELECTROSURGICAL) ×3
ELECTRODE REM PT RTRN 9FT ADLT (ELECTROSURGICAL) ×1 IMPLANT
GLOVE BIO SURGEON STRL SZ7 (GLOVE) ×3 IMPLANT
GLOVE INDICATOR 7.5 STRL GRN (GLOVE) ×3 IMPLANT
GOWN STRL REUS W/ TWL LRG LVL3 (GOWN DISPOSABLE) ×2 IMPLANT
GOWN STRL REUS W/TWL LRG LVL3 (GOWN DISPOSABLE) ×4
LABEL OR SOLS (LABEL) ×3 IMPLANT
NEEDLE HYPO 22GX1.5 SAFETY (NEEDLE) ×3 IMPLANT
NS IRRIG 500ML POUR BTL (IV SOLUTION) ×3 IMPLANT
PACK BASIN MINOR ARMC (MISCELLANEOUS) ×3 IMPLANT
SPONGE LAP 4X18 RFD (DISPOSABLE) ×3 IMPLANT
STRAP SAFETY 5IN WIDE (MISCELLANEOUS) ×3 IMPLANT
SUT CHROMIC GUT BROWN 0 54 (SUTURE) ×1 IMPLANT
SUT CHROMIC GUT BROWN 0 54IN (SUTURE) ×3
SUT MNCRL 4-0 (SUTURE) ×2
SUT MNCRL 4-0 27XMFL (SUTURE) ×1
SUT VIC AB 0 UR5 27 (SUTURE) ×3 IMPLANT
SUT VIC AB 2-0 UR6 27 (SUTURE) ×6 IMPLANT
SUTURE MNCRL 4-0 27XMF (SUTURE) ×1 IMPLANT
SYR 10ML LL (SYRINGE) ×3 IMPLANT

## 2017-07-29 SURGICAL SUPPLY — 44 items
CANISTER SUCT 1200ML W/VALVE (MISCELLANEOUS) ×3 IMPLANT
CATH KIT ON-Q SILVERSOAK 5IN (CATHETERS) ×6 IMPLANT
CLOSURE WOUND 1/2 X4 (GAUZE/BANDAGES/DRESSINGS)
DERMABOND ADVANCED (GAUZE/BANDAGES/DRESSINGS) ×4
DERMABOND ADVANCED .7 DNX12 (GAUZE/BANDAGES/DRESSINGS) ×2 IMPLANT
DRAPE LAPAROTOMY 100X77 ABD (DRAPES) IMPLANT
DRAPE LAPAROTOMY TRNSV 106X77 (MISCELLANEOUS) IMPLANT
DRSG OPSITE POSTOP 4X10 (GAUZE/BANDAGES/DRESSINGS) ×3 IMPLANT
DRSG TELFA 3X8 NADH (GAUZE/BANDAGES/DRESSINGS) IMPLANT
ELECT BLADE 6 FLAT ULTRCLN (ELECTRODE) ×3 IMPLANT
ELECT CAUTERY BLADE 6.4 (BLADE) ×3 IMPLANT
ELECT REM PT RETURN 9FT ADLT (ELECTROSURGICAL) ×3
ELECTRODE REM PT RTRN 9FT ADLT (ELECTROSURGICAL) ×1 IMPLANT
GAUZE SPONGE 4X4 12PLY STRL (GAUZE/BANDAGES/DRESSINGS) IMPLANT
GLOVE BIO SURGEON STRL SZ7 (GLOVE) ×6 IMPLANT
GLOVE INDICATOR 7.5 STRL GRN (GLOVE) ×6 IMPLANT
GOWN STRL REUS W/ TWL LRG LVL3 (GOWN DISPOSABLE) ×1 IMPLANT
GOWN STRL REUS W/ TWL XL LVL3 (GOWN DISPOSABLE) ×1 IMPLANT
GOWN STRL REUS W/TWL LRG LVL3 (GOWN DISPOSABLE) ×2
GOWN STRL REUS W/TWL XL LVL3 (GOWN DISPOSABLE) ×2
IRRIGATION STRYKERFLOW (MISCELLANEOUS) IMPLANT
IRRIGATOR STRYKERFLOW (MISCELLANEOUS)
KIT TURNOVER CYSTO (KITS) ×3 IMPLANT
LABEL OR SOLS (LABEL) ×3 IMPLANT
NS IRRIG 1000ML POUR BTL (IV SOLUTION) ×3 IMPLANT
PACK BASIN MAJOR ARMC (MISCELLANEOUS) ×3 IMPLANT
PAD OB MATERNITY 4.3X12.25 (PERSONAL CARE ITEMS) IMPLANT
SPONGE LAP 18X18 RF (DISPOSABLE) ×3 IMPLANT
STAPLER INSORB 30 2030 C-SECTI (MISCELLANEOUS) ×3 IMPLANT
STAPLER SKIN PROX 35W (STAPLE) ×3 IMPLANT
STRIP CLOSURE SKIN 1/2X4 (GAUZE/BANDAGES/DRESSINGS) IMPLANT
SUT ETHIBOND CT1 BRD #0 30IN (SUTURE) IMPLANT
SUT MNCRL AB 4-0 PS2 18 (SUTURE) ×3 IMPLANT
SUT PDS AB 1 TP1 96 (SUTURE) ×3 IMPLANT
SUT VIC AB 0 CT1 27 (SUTURE)
SUT VIC AB 0 CT1 27XCR 8 STRN (SUTURE) IMPLANT
SUT VIC AB 1 CT1 36 (SUTURE) IMPLANT
SUT VIC AB 2-0 SH 27 (SUTURE) ×4
SUT VIC AB 2-0 SH 27XBRD (SUTURE) ×2 IMPLANT
SUT VIC AB 4-0 PS2 18 (SUTURE) IMPLANT
SYR 10ML LL (SYRINGE) ×3 IMPLANT
SYR 30ML LL (SYRINGE) ×3 IMPLANT
TRAY FOLEY MTR SLVR 16FR STAT (SET/KITS/TRAYS/PACK) IMPLANT
TRAY PREP VAG/GEN (MISCELLANEOUS) IMPLANT

## 2017-07-29 NOTE — Lactation Note (Signed)
This note was copied from a baby's chart. Lactation Consultation Note  Patient Name: Cristina Le ZOXWR'UToday's Date: 07/29/2017 Reason for consult: Initial assessment   Maternal Data Has patient been taught Hand Expression?: Yes Does the patient have breastfeeding experience prior to this delivery?: Yes  Feeding Feeding Type: Breast Fed  LATCH Score Latch: Grasps breast easily, tongue down, lips flanged, rhythmical sucking.  Audible Swallowing: A few with stimulation  Type of Nipple: Everted at rest and after stimulation  Comfort (Breast/Nipple): Filling, red/small blisters or bruises, mild/mod discomfort  Hold (Positioning): No assistance needed to correctly position infant at breast.  LATCH Score: 8  Interventions    Lactation Tools Discussed/Used     Consult Status Consult Status: Complete  This is mom's third bf baby. No complications or questions from parents. Mom mentioned nipples are typically sore with each new baby for first 7 weeks. LC checked baby's latch and nothing was wrong. Suggested that mom takes finger to pull baby's chin down to loosen grasp on areola. LC also suggested comfort gels and coconut oil for pain relief.   Cristina Le 07/29/2017, 10:00 AM

## 2017-07-29 NOTE — Progress Notes (Signed)
Will release 2 units of RBC for administration in OR.

## 2017-07-29 NOTE — Progress Notes (Signed)
Patient became lethargic, minimally responsive, pale, and diaphoretic. Narcan given per MD order. Patient's level of consciousness increased, and patient became more alert and less pale. BP 72/39, heart rate 79, O2 98% on room air. MD notified. Will continue to monitor.

## 2017-07-29 NOTE — Anesthesia Procedure Notes (Signed)
Procedure Name: Intubation Date/Time: 07/29/2017 11:06 PM Performed by: Jonna Clark, CRNA Pre-anesthesia Checklist: Patient identified, Patient being monitored, Timeout performed, Emergency Drugs available and Suction available Patient Re-evaluated:Patient Re-evaluated prior to induction Oxygen Delivery Method: Circle system utilized Preoxygenation: Pre-oxygenation with 100% oxygen Induction Type: IV induction Ventilation: Mask ventilation without difficulty Laryngoscope Size: Mac and 3 Grade View: Grade I Tube type: Oral Tube size: 6.5 mm Number of attempts: 1 Airway Equipment and Method: Stylet Placement Confirmation: ETT inserted through vocal cords under direct vision,  positive ETCO2 and breath sounds checked- equal and bilateral Secured at: 21 cm Tube secured with: Tape Dental Injury: Teeth and Oropharynx as per pre-operative assessment

## 2017-07-29 NOTE — Progress Notes (Signed)
Patient became lethargic, minimally responsive, pale, and diaphoretic. Narcan given per MD order. Patient's level of consciousness increased, and patient became more alert and less pale. BP 79/46, heart rate 74, O2 100% on room air. Will continue to monitor.

## 2017-07-29 NOTE — Progress Notes (Signed)
Obstetric and Gynecology  Subjective  Interval update  Objective   Vitals:   07/29/17 2054 07/29/17 2059  BP: (!) 81/46 (!) 79/46  Pulse: 78 74  Resp:    Temp:    SpO2: 100% 100%     Intake/Output Summary (Last 24 hours) at 07/29/2017 2101 Last data filed at 07/29/2017 2008 Gross per 24 hour  Intake 800 ml  Output 330 ml  Net 470 ml    General: NAD Cardiovascular: RRR Abdomen:soft, appropriately tender, non-distended, incision D/C/I Extremities: no edema, pedal pulses 1+  Labs: Results for orders placed or performed during the hospital encounter of 07/27/17 (from the past 24 hour(s))  CBC     Status: Abnormal   Collection Time: 07/29/17  8:15 PM  Result Value Ref Range   WBC 19.3 (H) 3.6 - 11.0 K/uL   RBC 3.42 (L) 3.80 - 5.20 MIL/uL   Hemoglobin 10.6 (L) 12.0 - 16.0 g/dL   HCT 30.832.2 (L) 65.735.0 - 84.647.0 %   MCV 94.3 80.0 - 100.0 fL   MCH 31.0 26.0 - 34.0 pg   MCHC 32.8 32.0 - 36.0 g/dL   RDW 96.214.2 95.211.5 - 84.114.5 %   Platelets 223 150 - 440 K/uL    Cultures: Results for orders placed or performed in visit on 06/25/17  GC/Chlamydia Probe Amp([facility])     Status: None   Collection Time: 06/25/17  3:12 PM  Result Value Ref Range Status   Chlamydia trachomatis, NAA Negative Negative Final   Neisseria gonorrhoeae by PCR Negative Negative Final  Strep Gp B NAA     Status: None   Collection Time: 06/25/17  3:12 PM  Result Value Ref Range Status   Strep Gp B NAA Negative Negative Final    Comment: Centers for Disease Control and Prevention (CDC) and American Congress of Obstetricians and Gynecologists (ACOG) guidelines for prevention of perinatal group B streptococcal (GBS) disease specify co-collection of a vaginal and rectal swab specimen to maximize sensitivity of GBS detection. Per the CDC and ACOG, swabbing both the lower vagina and rectum substantially increases the yield of detection compared with sampling the vagina alone. Penicillin G, ampicillin, or cefazolin are  indicated for intrapartum prophylaxis of perinatal GBS colonization. Reflex susceptibility testing should be performed prior to use of clindamycin only on GBS isolates from penicillin-allergic women who are considered a high risk for anaphylaxis. Treatment with vancomycin without additional testing is warranted if resistance to clindamycin is noted.     Imaging:  Assessment   27 y.o. L2G4010G3P2002 PPD2 TSVD POD0 BTL  Plan    1) Syncopal episode and hypotension - pulse remains normal, foley placed with good clear urine output.  Discussed case with Dr. Pernell DupreAdams of anesthesia 0.4mg  of narcan may be repeated every 30 minutes as needed.  Also given 0.2mg  of romazicon for benzo reversal if more sensitive to Versed.  I've also ordered end tital CO2 monitoring.  Repeat CBC at 21:15 or 1-hr after initial CBC.  As internal bleeding although less likely still remains in the differential I will go ahead and crossmatch 4 units to remain on standby.  The patient's husband reports similar syncopal and hypotensive episodes postpartum with both of her prior deliveries.

## 2017-07-29 NOTE — Progress Notes (Signed)
Patient became lethargic, minimally responsive, pale, and diaphoretic. Narcan given per MD order. Patient's level of consciousness increased, and patient became more alert and less pale. BP 81/46, heart rate 78, O2 100% on room air. Will continue to monitor.

## 2017-07-29 NOTE — Anesthesia Preprocedure Evaluation (Signed)
Anesthesia Evaluation  Patient identified by MRN, date of birth, ID band Patient awake    Reviewed: Allergy & Precautions, H&P , NPO status , Patient's Chart, lab work & pertinent test results  Airway Mallampati: II  TM Distance: >3 FB Neck ROM: full    Dental  (+) Chipped   Pulmonary neg pulmonary ROS, neg shortness of breath,           Cardiovascular Exercise Tolerance: Good (-) angina(-) Past MI and (-) DOE negative cardio ROS       Neuro/Psych negative neurological ROS  negative psych ROS   GI/Hepatic negative GI ROS, Neg liver ROS, neg GERD  ,  Endo/Other  negative endocrine ROS  Renal/GU      Musculoskeletal   Abdominal   Peds  Hematology negative hematology ROS (+)   Anesthesia Other Findings Past Medical History: No date: Anaphylactic reaction 01/14/12; 04/11/15: History of Papanicolaou smear of cervix     Comment:  NEG; NEG, CT/GC/TR NEG;  Past Surgical History: No date: NO PAST SURGERIES  BMI    Body Mass Index:  26.91 kg/m      Reproductive/Obstetrics (+) Breast feeding                              Anesthesia Physical Anesthesia Plan  ASA: I  Anesthesia Plan: General ETT   Post-op Pain Management:    Induction: Intravenous  PONV Risk Score and Plan: Ondansetron, Dexamethasone, Midazolam and Treatment may vary due to age or medical condition  Airway Management Planned: Oral ETT  Additional Equipment:   Intra-op Plan:   Post-operative Plan: Extubation in OR  Informed Consent: I have reviewed the patients History and Physical, chart, labs and discussed the procedure including the risks, benefits and alternatives for the proposed anesthesia with the patient or authorized representative who has indicated his/her understanding and acceptance.   Dental Advisory Given  Plan Discussed with: Anesthesiologist, CRNA and Surgeon  Anesthesia Plan Comments: (Patient  consented for risks of anesthesia including but not limited to:  - adverse reactions to medications - damage to teeth, lips or other oral mucosa - sore throat or hoarseness - Damage to heart, brain, lungs or loss of life  Patient voiced understanding.)        Anesthesia Quick Evaluation

## 2017-07-29 NOTE — Progress Notes (Signed)
Subjective:  Called for syncopal episodes on patient attempting to ambulate to bathroom with assistance first time postoperative  Objective:   Blood pressure (!) 87/63, pulse 82, temperature 97.7 F (36.5 C), temperature source Oral, resp. rate 18, height 5\' 8"  (1.727 m), weight 177 lb (80.3 kg), last menstrual period 10/16/2016, SpO2 97 %, unknown if currently breastfeeding.  General: NAD, lethargic but responsive Pulmonary: shallow breathing Abdomen: non-distended, non-tender, fundus firm at level of umbilicus. Incision D/C/I Extremities: no edema, no erythema, no tenderness  Results for orders placed or performed during the hospital encounter of 07/27/17 (from the past 72 hour(s))  CBC     Status: Abnormal   Collection Time: 07/27/17 12:50 PM  Result Value Ref Range   WBC 10.7 3.6 - 11.0 K/uL   RBC 3.80 3.80 - 5.20 MIL/uL   Hemoglobin 12.2 12.0 - 16.0 g/dL   HCT 16.1 (L) 09.6 - 04.5 %   MCV 91.8 80.0 - 100.0 fL   MCH 32.1 26.0 - 34.0 pg   MCHC 34.9 32.0 - 36.0 g/dL   RDW 40.9 81.1 - 91.4 %   Platelets 204 150 - 440 K/uL    Comment: Performed at [facility], [address] [place], [place] [address]  RPR     Status: None   Collection Time: 07/27/17 12:50 PM  Result Value Ref Range   RPR Ser Ql Non Reactive Non Reactive    Comment: (NOTE) Performed At: [facility] [address] [place], [place] [id number] Jolene Schimke MD [phone] Performed at [facility], [address] [place], [place] [address]   Type and screen     Status: None   Collection Time: 07/27/17 12:50 PM  Result Value Ref Range   ABO/RH(D) A POS    Antibody Screen NEG    Sample Expiration      07/30/2017 Performed at [facility], [address] [place], [place] [address]   CBC     Status: Abnormal   Collection Time: 07/28/17  4:57 AM  Result Value Ref Range   WBC 15.3 (H) 3.6 - 11.0 K/uL   RBC 3.96 3.80 - 5.20 MIL/uL   Hemoglobin 12.6 12.0 -  16.0 g/dL   HCT 53.6 64.4 - 03.4 %   MCV 92.1 80.0 - 100.0 fL   MCH 31.8 26.0 - 34.0 pg   MCHC 34.5 32.0 - 36.0 g/dL   RDW 74.2 59.5 - 63.8 %   Platelets 192 150 - 440 K/uL    Comment: Performed at [facility], [address] [place], [place] [address]    Assessment:   27 y.o. G3P2002 postpartum day # 2, POD1 BTL  Plan:    1) Syncopal episode - hypotensive, with low pulse and shallow breathing.  Abdomen appropriately tender around incision, non-distended.  Administered 1 dose of narcan with improvement in BP and patient more aroused.  Stat CBC pending.  Will monitor overnight.  Suspect opioid sensitivity ( of Fentanyl and 1mg  of dilaudid received in PACU postoperatively).  Intra-abdominal bleeding remains in differential but given low pulse appears less likely, in addition each tube was double suture ligated at time of surgery and hemostatic on returning to the abdomen. - toradol added for pain - heating pad  2) Blood Type --/--/A POS (06/18 1250) / Rubella 1.89 (11/27 1538) / Varicella Immune  3) TDAP status declines  4) Feeding plan breast  5)  Education given regarding options for contraception, as well as compatibility with  breast feeding if applicable.  Patient plans on tubal ligation for contraception.  6) Disposition anticipated discharge tomorrow  Vena AustriaAndreas Roddy Bellamy, MD, Merlinda FrederickFACOG [facility], [facility] 07/29/2017, 7:46 PM

## 2017-07-29 NOTE — Anesthesia Preprocedure Evaluation (Signed)
Anesthesia Evaluation  Patient identified by MRN, date of birth, ID band Patient awake    Reviewed: Allergy & Precautions, H&P , NPO status , Patient's Chart, lab work & pertinent test results, reviewed documented beta blocker date and time   Airway Mallampati: II  TM Distance: >3 FB Neck ROM: full    Dental  (+) Teeth Intact   Pulmonary neg pulmonary ROS,    Pulmonary exam normal        Cardiovascular Exercise Tolerance: Good negative cardio ROS Normal cardiovascular exam Rhythm:regular Rate:Normal     Neuro/Psych negative neurological ROS  negative psych ROS   GI/Hepatic negative GI ROS, Neg liver ROS,   Endo/Other  negative endocrine ROS  Renal/GU negative Renal ROS  negative genitourinary   Musculoskeletal   Abdominal   Peds  Hematology negative hematology ROS (+)   Anesthesia Other Findings Past Medical History: No date: Anaphylactic reaction 01/14/12; 04/11/15: History of Papanicolaou smear of cervix     Comment:  NEG; NEG, CT/GC/TR NEG; Past Surgical History: No date: NO PAST SURGERIES BMI    Body Mass Index:  26.91 kg/m     Reproductive/Obstetrics negative OB ROS                             Anesthesia Physical Anesthesia Plan  ASA: II and emergent  Anesthesia Plan: General ETT   Post-op Pain Management:    Induction:   PONV Risk Score and Plan:   Airway Management Planned:   Additional Equipment:   Intra-op Plan:   Post-operative Plan:   Informed Consent: I have reviewed the patients History and Physical, chart, labs and discussed the procedure including the risks, benefits and alternatives for the proposed anesthesia with the patient or authorized representative who has indicated his/her understanding and acceptance.   Dental Advisory Given  Plan Discussed with: CRNA  Anesthesia Plan Comments:         Anesthesia Quick Evaluation

## 2017-07-29 NOTE — Transfer of Care (Signed)
Immediate Anesthesia Transfer of Care Note  Patient: Cristina Le  Procedure(s) Performed: POST PARTUM TUBAL LIGATION (N/A )  Patient Location: PACU  Anesthesia Type:General  Level of Consciousness: drowsy and patient cooperative  Airway & Oxygen Therapy: Patient Spontanous Breathing and Patient connected to face mask oxygen  Post-op Assessment: Report given to RN, Post -op Vital signs reviewed and stable and Patient moving all extremities  Post vital signs: Reviewed and stable  Last Vitals:  Vitals Value Taken Time  BP 107/58 07/29/2017  3:25 PM  Temp 36.1 C 07/29/2017  3:24 PM  Pulse 65 07/29/2017  3:27 PM  Resp 16 07/29/2017  3:27 PM  SpO2 100 % 07/29/2017  3:27 PM  Vitals shown include unvalidated device data.  Last Pain:  Vitals:   07/29/17 1524  TempSrc: Tympanic  PainSc: Asleep      Patients Stated Pain Goal: 0 (07/27/17 1800)  Complications: No apparent anesthesia complications

## 2017-07-29 NOTE — Progress Notes (Signed)
Patient back on MB floor from getting BTL

## 2017-07-29 NOTE — Progress Notes (Signed)
Bedside ultrasound does show free fluid in the abdomen.  Remains hypotensive. Still no tachycardia.  Will return to OR for emergent exploratory to evaluate for presence of active bleeding.  Release first unit of pRBC

## 2017-07-29 NOTE — Progress Notes (Signed)
Results for orders placed or performed during the hospital encounter of 07/27/17 (from the past 24 hour(s))  CBC     Status: Abnormal   Collection Time: 07/29/17  8:15 PM  Result Value Ref Range   WBC 19.3 (H) 3.6 - 11.0 K/uL   RBC 3.42 (L) 3.80 - 5.20 MIL/uL   Hemoglobin 10.6 (L) 12.0 - 16.0 g/dL   HCT 16.132.2 (L) 09.635.0 - 04.547.0 %   MCV 94.3 80.0 - 100.0 fL   MCH 31.0 26.0 - 34.0 pg   MCHC 32.8 32.0 - 36.0 g/dL   RDW 40.914.2 81.111.5 - 91.414.5 %   Platelets 223 150 - 440 K/uL  Prepare RBC     Status: None   Collection Time: 07/29/17  9:07 PM  Result Value Ref Range   Order Confirmation      ORDER PROCESSED BY BLOOD BANK Performed at [facility], [address] [place], [place] [address]   CBC     Status: Abnormal   Collection Time: 07/29/17  9:16 PM  Result Value Ref Range   WBC 17.6 (H) 3.6 - 11.0 K/uL   RBC 3.01 (L) 3.80 - 5.20 MIL/uL   Hemoglobin 9.5 (L) 12.0 - 16.0 g/dL   HCT 62.128.1 (L) 30.835.0 - 65.747.0 %   MCV 93.3 80.0 - 100.0 fL   MCH 31.7 26.0 - 34.0 pg   MCHC 34.0 32.0 - 36.0 g/dL   RDW 84.614.1 96.211.5 - 95.214.5 %   Platelets 230 150 - 440 K/uL   Slight drop in H&H 10.6 to 9.5 but patient has also received boluses and IV fluids secondary to BP which may account for this.  Hemodynamically while still hypotensive her pulse rate remains normal.  Will continue to trend CBC's.  2 Units of RBC on standby

## 2017-07-29 NOTE — Progress Notes (Signed)
MD decision to take pt back to surgery, OR notified by MD

## 2017-07-29 NOTE — Progress Notes (Signed)
Patient taken to OR escorted by RN and Shift Coordinator. Patient lethargic. Once arrrived in PACU, consents obtained from husband and  report offered to [place].

## 2017-07-29 NOTE — Progress Notes (Signed)
Patient became lethargic, minimally responsive, pale, and diaphoretic. Dr. Bonney AidStaebler notified. Narcan given per verbal MD order. Patient's level of consciousness minimally increased. BP 73/43, heart rate 88, O2 99% on room air. MD at bedside assessing patient. Bedside ultrasound performed to assess for bleeding. Will continue to monitor.

## 2017-07-29 NOTE — Op Note (Signed)
Preoperative Diagnosis: 1) 27 y.o. [id number] with undesired fertility  Postoperative Diagnosis: 1) 27 y.o. [id number] with undesired fertility  Operation Performed: Postpartum bilateral tubal ligation via Pomeroy method  Indication: 27 y.o. [id number]  with undesired fertility, desires permanent sterilization.  Other reversible forms of contraception were discussed with patient; she declines all other modalities. Permanent nature of as well as associated risks of the procedure discussed with patient including but not limited to: risk of regret, permanence of method, bleeding, infection, injury to surrounding organs and need for additional procedures.  Failure risk of 0.5-1% with increased risk of ectopic gestation if pregnancy occurs was also discussed with patient.    Surgeon: Vena AustriaAndreas Ahnyla Mendel, MD  Anesthesia: General  Preoperative Antibiotics: none  Estimated Blood Loss: 5 mL  IV Fluids: 1L  Drains or Tubes: none  Implants: none  Specimens Removed: Bilateral segment of fallopian tube  Complications: none  Intraoperative Findings: Normal tubes, ovaries, and uterine fundus.  Good 1cm portion of tube excised from each fallopian tube with tubal ostia visualized.  Patient Condition: stable  Procedure in Detail:  Patient was taken to the operating room where she was administered general anesthesia.  She was positioned in the supine position, prepped and draped in the usual sterile fashion.  Prior to proceeding with procedure a time out was performed.  Attention was turned to the patient's abdomen.  The umbilicus was infiltrated with 1% Sensorcaine, before making a 3cm vertical infraumbilical stab incision using an 11 blade scalpel.  The subcutaneous tissue was dissected of the rectus fascia using a hemostat.  The fascia was then grasped with the hemostat, tented up, regrasped with second hemostat before releasing and re-grasping with the first hemostat to verify no bowl was grasped.  The fascia  was incised using a mayo scissors.  The fascial edges were taged with 0 Vicryl stay sutures prior to removing them.  Army-[employer]y retractors were placed the peritoneum was identified, grasped with a hemostat, incised using metzenbaum scissors after verifying that the tissue was translucent by placing the scissors behind the tissue to verify it was free of omentum or bowl.  The operators fingers was placed through the incision and noted it to be free of adhesive disease or umbilical hernias.  The table was put into left tilt, a small moist lap pad tagged with a hemostat was then placed through in incision using Singley forceps to pack away and bowl or omentum.  The right fallopian tube was identified and walked out to its fimbriated end using the Singley forceps and a Babcock clamps.  A mid isthmic portion of tube was then suture ligated twice using a 0 chromic wheel.  The intervening knuckle of tube was then excised.  The tube was noted to be hemostatic before returning it to the abdomen.  The mini-lap was removed and the patient table was tilted to the right with the same procedure being repeated for the patient's left tube.    The previously placed stay sutures were tied together, no fascial defects were noted after closure.  The skin was closed using a 4-0 Monocryl in a subcuticular fashion.  The incision was then dressed with surgical skin glue.  Sponge needle and instrument counts were correct time two.  The patient tolerated the procedure well and was taken to the recovery room in stable condition.

## 2017-07-29 NOTE — Progress Notes (Signed)
Pt brought to PACU to await surgery. MD order to start blood transfusion. Blood transfusion started at 2255 and then pt taken immediately to OR.

## 2017-07-29 NOTE — Progress Notes (Signed)
Patient stated that she felt faint. Patient became lethargic, minimally responsive, pale, and diaphoretic. Patient minimally responsive to sternal rubbing. Narcan given per verbal MD order. Patient's level of consciousnessunchanged. BP 81/45. MD orders to take patient back to OR to perform exploratory laparotomy to look to possible bleeding in abdomen.

## 2017-07-29 NOTE — Anesthesia Post-op Follow-up Note (Signed)
Anesthesia QCDR form completed.        

## 2017-07-29 NOTE — Anesthesia Procedure Notes (Signed)
Procedure Name: Intubation Date/Time: 07/29/2017 2:39 PM Performed by: Sherol DadeMacMang, Carver Murakami H, CRNA Pre-anesthesia Checklist: Patient identified, Emergency Drugs available, Suction available, Patient being monitored and Timeout performed Patient Re-evaluated:Patient Re-evaluated prior to induction Oxygen Delivery Method: Circle system utilized Preoxygenation: Pre-oxygenation with 100% oxygen Induction Type: IV induction Ventilation: Mask ventilation without difficulty Laryngoscope Size: Miller and 3 Grade View: Grade II Tube type: Oral Tube size: 7.0 mm Number of attempts: 1 Airway Equipment and Method: Stylet Placement Confirmation: ETT inserted through vocal cords under direct vision,  positive ETCO2,  CO2 detector and breath sounds checked- equal and bilateral Secured at: 21 cm Tube secured with: Tape Dental Injury: Teeth and Oropharynx as per pre-operative assessment

## 2017-07-29 NOTE — Progress Notes (Signed)
Patient to OR for BTL 

## 2017-07-29 NOTE — Progress Notes (Signed)
Patient's husband came to RN and asked for assistance getting patient to the bathroom. RN assisted patient to sit on bedside and patient stated that she felt like she was going to pass out. Patient pale, diaphoretic, and lethargic. Patient safely laid back down into bed. RN called for assistance. BP 78/46, heart rate 72, O2 98% on room air. Dr. Bonney AidStaebler notified. MD came to see patient and 500 mL bolus ordered. Narcan ordered also. Medications given. Patient then became more alert, less pale, and aware of surroundings. Pain 7 out of 10. Toradol given per MD orders. BP now 86/59, heart rate 85, O2 100% on room air. Will continue to monitor.

## 2017-07-29 NOTE — Progress Notes (Signed)
Subjective:  Doing well no concerns, moderate lochia.  No fevers, no chills  Objective:   Blood pressure 115/82, pulse 72, temperature 97.9 F (36.6 C), temperature source Oral, resp. rate 18, height 5\' 8"  (1.727 m), weight 177 lb (80.3 kg), last menstrual period 10/16/2016, SpO2 98 %, unknown if currently breastfeeding.  General: NAD Pulmonary: no increased work of breathing Abdomen: non-distended, non-tender, fundus firm at level of umbilicus Extremities: no edema, no erythema, no tenderness  Results for orders placed or performed during the hospital encounter of 07/27/17 (from the past 72 hour(s))  CBC     Status: Abnormal   Collection Time: 07/27/17 12:50 PM  Result Value Ref Range   WBC 10.7 3.6 - 11.0 K/uL   RBC 3.80 3.80 - 5.20 MIL/uL   Hemoglobin 12.2 12.0 - 16.0 g/dL   HCT 09.8 (L) 11.9 - 14.7 %   MCV 91.8 80.0 - 100.0 fL   MCH 32.1 26.0 - 34.0 pg   MCHC 34.9 32.0 - 36.0 g/dL   RDW 82.9 56.2 - 13.0 %   Platelets 204 150 - 440 K/uL    Comment: Performed at [facility], [address] [place], [place] [address]  RPR     Status: None   Collection Time: 07/27/17 12:50 PM  Result Value Ref Range   RPR Ser Ql Non Reactive Non Reactive    Comment: (NOTE) Performed At: [facility] [address] [place], [place] [id number] Jolene Schimke MD [phone] Performed at [facility], [address] [place], [place] [address]   Type and screen     Status: None   Collection Time: 07/27/17 12:50 PM  Result Value Ref Range   ABO/RH(D) A POS    Antibody Screen NEG    Sample Expiration      07/30/2017 Performed at [facility], [address] [place], [place] [address]   CBC     Status: Abnormal   Collection Time: 07/28/17  4:57 AM  Result Value Ref Range   WBC 15.3 (H) 3.6 - 11.0 K/uL   RBC 3.96 3.80 - 5.20 MIL/uL   Hemoglobin 12.6 12.0 - 16.0 g/dL   HCT 42.5 95.6 - 38.7 %   MCV 92.1 80.0 - 100.0 fL   MCH 31.8 26.0 - 34.0  pg   MCHC 34.5 32.0 - 36.0 g/dL   RDW 56.4 33.2 - 95.1 %   Platelets 192 150 - 440 K/uL    Comment: Performed at [facility], [address], [place], [place] [address]    Assessment:   27 y.o. G3P2002 postpartum day # 2 TSVD  Plan:    1) Blood loss - no evidence of anemia postpartum  2) Blood Type --/--/A POS (06/18 1250) / Rubella 1.89 (11/27 1538) / Varicella Immune  3) TDAP status declines  4) Feeding plan breast  5)  Education given regarding options for contraception, as well as compatibility with breast feeding if applicable.  Patient plans on tubal ligation for contraception.  No contra-indications to postpartum BTL (prior pelvic or abdominal surgery, PID, GC/CT, endometriosis, or fibroids)  27 y.o. [id number]  with undesired fertility, desires permanent sterilization.  Other reversible forms of contraception were discussed with patient; she declines all other modalities. Permanent nature of as well as associated risks of the procedure discussed with patient including but not limited to: risk of regret, permanence of method, bleeding, infection, injury to surrounding organs and need for additional procedures.  Failure risk of 0.5-1%  with increased risk of ectopic gestation if pregnancy occurs was also discussed with patient.     6) Disposition - home following BTL  Vena AustriaAndreas Brondon Wann, MD, Merlinda FrederickFACOG [facility], [facility] 07/29/2017, 9:18 AM

## 2017-07-29 NOTE — Progress Notes (Signed)
Ultrasound at pt bedside to perform ultrasound on abdomen as ordered

## 2017-07-30 ENCOUNTER — Encounter: Payer: Self-pay | Admitting: Obstetrics and Gynecology

## 2017-07-30 DIAGNOSIS — K661 Hemoperitoneum: Secondary | ICD-10-CM

## 2017-07-30 DIAGNOSIS — I9581 Postprocedural hypotension: Secondary | ICD-10-CM

## 2017-07-30 LAB — PREPARE RBC (CROSSMATCH)

## 2017-07-30 LAB — CBC
HEMATOCRIT: 27.7 % — AB (ref 35.0–47.0)
HEMOGLOBIN: 9.6 g/dL — AB (ref 12.0–16.0)
MCH: 30.8 pg (ref 26.0–34.0)
MCHC: 34.5 g/dL (ref 32.0–36.0)
MCV: 89.5 fL (ref 80.0–100.0)
PLATELETS: 199 10*3/uL (ref 150–440)
RBC: 3.1 MIL/uL — ABNORMAL LOW (ref 3.80–5.20)
RDW: 14.4 % (ref 11.5–14.5)
WBC: 19.3 10*3/uL — AB (ref 3.6–11.0)

## 2017-07-30 MED ORDER — BUPIVACAINE HCL 0.5 % IJ SOLN
INTRAMUSCULAR | Status: DC | PRN
  Administered 2017-07-30: 10 mL

## 2017-07-30 MED ORDER — OXYCODONE-ACETAMINOPHEN 5-325 MG PO TABS
2.0000 | ORAL_TABLET | ORAL | Status: DC | PRN
  Administered 2017-07-31 – 2017-08-01 (×4): 2 via ORAL
  Filled 2017-07-30 (×7): qty 2

## 2017-07-30 MED ORDER — SUGAMMADEX SODIUM 200 MG/2ML IV SOLN
INTRAVENOUS | Status: DC | PRN
  Administered 2017-07-30: 160 mg via INTRAVENOUS

## 2017-07-30 MED ORDER — LACTATED RINGERS IV SOLN: INTRAVENOUS | Status: DC

## 2017-07-30 MED ORDER — ACETAMINOPHEN 325 MG PO TABS
650.0000 mg | ORAL_TABLET | ORAL | Status: DC | PRN
  Administered 2017-07-30 – 2017-08-02 (×4): 650 mg via ORAL
  Filled 2017-07-30 (×4): qty 2

## 2017-07-30 MED ORDER — ONDANSETRON HCL 4 MG/2ML IJ SOLN
4.0000 mg | Freq: Once | INTRAMUSCULAR | Status: DC | PRN
Start: 2017-07-30 — End: 2017-07-30

## 2017-07-30 MED ORDER — ALBUMIN HUMAN 5 % IV SOLN
INTRAVENOUS | Status: DC | PRN
  Administered 2017-07-30: via INTRAVENOUS

## 2017-07-30 MED ORDER — WITCH HAZEL-GLYCERIN EX PADS: 1.0000 "application " | MEDICATED_PAD | CUTANEOUS | Status: DC | PRN

## 2017-07-30 MED ORDER — MENTHOL 3 MG MT LOZG
1.0000 | LOZENGE | OROMUCOSAL | Status: DC | PRN
  Filled 2017-07-30: qty 9

## 2017-07-30 MED ORDER — COCONUT OIL OIL: 1.0000 "application " | TOPICAL_OIL | Status: DC | PRN

## 2017-07-30 MED ORDER — DIBUCAINE 1 % RE OINT: 1.0000 "application " | TOPICAL_OINTMENT | RECTAL | Status: DC | PRN

## 2017-07-30 MED ORDER — FENTANYL CITRATE (PF) 100 MCG/2ML IJ SOLN: 25.0000 ug | INTRAMUSCULAR | Status: DC | PRN

## 2017-07-30 MED ORDER — NALOXONE HCL 0.4 MG/ML IJ SOLN
INTRAMUSCULAR | Status: AC
  Filled 2017-07-30: qty 1

## 2017-07-30 MED ORDER — IBUPROFEN 600 MG PO TABS
600.0000 mg | ORAL_TABLET | Freq: Four times a day (QID) | ORAL | Status: DC
Start: 2017-07-30 — End: 2017-08-02
  Administered 2017-07-30 – 2017-08-02 (×12): 600 mg via ORAL
  Filled 2017-07-30 (×13): qty 1

## 2017-07-30 MED ORDER — OXYCODONE-ACETAMINOPHEN 5-325 MG PO TABS
1.0000 | ORAL_TABLET | ORAL | Status: DC | PRN
  Administered 2017-07-31 – 2017-08-02 (×4): 1 via ORAL
  Filled 2017-07-30 (×2): qty 1

## 2017-07-30 MED ORDER — BUPIVACAINE ON-Q PAIN PUMP (FOR ORDER SET NO CHG): INJECTION | Status: DC

## 2017-07-30 MED ORDER — ONDANSETRON HCL 4 MG/2ML IJ SOLN: 4.0000 mg | Freq: Four times a day (QID) | INTRAMUSCULAR | Status: DC | PRN

## 2017-07-30 MED ORDER — AMMONIA AROMATIC IN INHA
RESPIRATORY_TRACT | Status: AC
  Filled 2017-07-30: qty 10

## 2017-07-30 MED ORDER — DIPHENHYDRAMINE HCL 25 MG PO CAPS: 25.0000 mg | ORAL_CAPSULE | Freq: Four times a day (QID) | ORAL | Status: DC | PRN

## 2017-07-30 MED ORDER — SIMETHICONE 80 MG PO CHEW
80.0000 mg | CHEWABLE_TABLET | ORAL | Status: DC
  Administered 2017-07-31: 80 mg via ORAL
  Filled 2017-07-30 (×2): qty 1

## 2017-07-30 MED ORDER — SIMETHICONE 80 MG PO CHEW
80.0000 mg | CHEWABLE_TABLET | ORAL | Status: DC | PRN
  Filled 2017-07-30: qty 1

## 2017-07-30 MED ORDER — OXYTOCIN 40 UNITS IN LACTATED RINGERS INFUSION - SIMPLE MED
2.5000 [IU]/h | INTRAVENOUS | Status: AC
  Filled 2017-07-30: qty 1000

## 2017-07-30 MED ORDER — PRENATAL MULTIVITAMIN CH
1.0000 | ORAL_TABLET | Freq: Every day | ORAL | Status: DC
  Administered 2017-07-30 – 2017-08-01 (×3): 1 via ORAL
  Filled 2017-07-30 (×3): qty 1

## 2017-07-30 MED ORDER — SIMETHICONE 80 MG PO CHEW
80.0000 mg | CHEWABLE_TABLET | Freq: Three times a day (TID) | ORAL | Status: DC
Start: 2017-07-30 — End: 2017-08-02
  Administered 2017-07-30 – 2017-08-01 (×8): 80 mg via ORAL
  Filled 2017-07-30 (×9): qty 1

## 2017-07-30 MED ORDER — SENNOSIDES-DOCUSATE SODIUM 8.6-50 MG PO TABS
2.0000 | ORAL_TABLET | ORAL | Status: DC
  Administered 2017-07-31 – 2017-08-01 (×2): 2 via ORAL
  Filled 2017-07-30 (×3): qty 2

## 2017-07-30 MED ORDER — SODIUM CHLORIDE 0.9% IV SOLUTION: Freq: Once | INTRAVENOUS | Status: DC

## 2017-07-30 NOTE — Progress Notes (Signed)
Subjective:  Doing well, no concerns.  Feels much better from pain standpoint then yesterday.  No nausea or emesis.    Objective:  Blood pressure (!) 96/57, pulse 73, temperature 98.5 F (36.9 C), temperature source Oral, resp. rate 16, height 5\' 8"  (1.727 m), weight 177 lb (80.3 kg), last menstrual period 10/16/2016, SpO2 97 %, unknown if currently breastfeeding.  Intake/Output      06/20 0701 - 06/21 0700 06/21 0701 - 06/22 0700   P.O.     I.V. (mL/kg) 2050 (25.5)    Blood 640    IV Piggyback 250    Total Intake(mL/kg) 2940 (36.6)    Urine (mL/kg/hr) 1105 (0.6)    Blood 2005    Total Output 3110    Net -170         Urine Occurrence 1 x      General: NAD Pulmonary: no increased work of breathing Abdomen: non-distended, non-tender, fundus firm at level of umbilicus Incision: D/C/I Extremities: no edema, no erythema, no tenderness  Results for orders placed or performed during the hospital encounter of 07/27/17 (from the past 72 hour(s))  CBC     Status: Abnormal   Collection Time: 07/27/17 12:50 PM  Result Value Ref Range   WBC 10.7 3.6 - 11.0 K/uL   RBC 3.80 3.80 - 5.20 MIL/uL   Hemoglobin 12.2 12.0 - 16.0 g/dL   HCT 16.134.9 (L) 09.635.0 - 04.547.0 %   MCV 91.8 80.0 - 100.0 fL   MCH 32.1 26.0 - 34.0 pg   MCHC 34.9 32.0 - 36.0 g/dL   RDW 40.914.0 81.111.5 - 91.414.5 %   Platelets 204 150 - 440 K/uL    Comment: Performed at [facility], [address] [place], [place] [address]  RPR     Status: None   Collection Time: 07/27/17 12:50 PM  Result Value Ref Range   RPR Ser Ql Non Reactive Non Reactive    Comment: (NOTE) Performed At: [facility] [address] [place], [place] [id number] Jolene SchimkeNagendra Sanjai MD QI:[phone] Performed at [facility], [address] [place], [place] [address]   Type and screen     Status: None (Preliminary result)   Collection Time: 07/27/17 12:50 PM  Result Value Ref Range   ABO/RH(D) A POS    Antibody Screen NEG    Sample  Expiration 07/30/2017    Unit Number [id number]    Blood Component Type RED CELLS,LR    Unit division 00    Status of Unit ISSUED,FINAL    Transfusion Status OK TO TRANSFUSE    Crossmatch Result      Compatible Performed at [facility], [address] [place], [place] [address]    Unit Number [id number]    Blood Component Type RED CELLS,LR    Unit division 00    Status of Unit ISSUED,FINAL    Transfusion Status OK TO TRANSFUSE    Crossmatch Result Compatible    Unit Number [id number]    Blood Component Type RED CELLS,LR    Unit division 00    Status of Unit ALLOCATED    Transfusion Status OK TO TRANSFUSE    Crossmatch Result Compatible    Unit Number [id number]    Blood Component Type RBC LR PHER1    Unit division 00    Status of Unit ALLOCATED    Transfusion Status OK TO TRANSFUSE    Crossmatch Result Compatible   CBC     Status: Abnormal  Collection Time: 07/28/17  4:57 AM  Result Value Ref Range   WBC 15.3 (H) 3.6 - 11.0 K/uL   RBC 3.96 3.80 - 5.20 MIL/uL   Hemoglobin 12.6 12.0 - 16.0 g/dL   HCT 40.9 81.1 - 91.4 %   MCV 92.1 80.0 - 100.0 fL   MCH 31.8 26.0 - 34.0 pg   MCHC 34.5 32.0 - 36.0 g/dL   RDW 78.2 95.6 - 21.3 %   Platelets 192 150 - 440 K/uL    Comment: Performed at [facility], [address] [place], [place] [address]  CBC     Status: Abnormal   Collection Time: 07/29/17  8:15 PM  Result Value Ref Range   WBC 19.3 (H) 3.6 - 11.0 K/uL   RBC 3.42 (L) 3.80 - 5.20 MIL/uL   Hemoglobin 10.6 (L) 12.0 - 16.0 g/dL   HCT 84.6 (L) 96.2 - 95.2 %   MCV 94.3 80.0 - 100.0 fL   MCH 31.0 26.0 - 34.0 pg   MCHC 32.8 32.0 - 36.0 g/dL   RDW 84.1 32.4 - 40.1 %   Platelets 223 150 - 440 K/uL    Comment: Performed at [facility], [address] [place], [place] [address]  Prepare RBC     Status: None   Collection Time: 07/29/17  9:07 PM  Result Value Ref Range   Order Confirmation      ORDER PROCESSED BY BLOOD  BANK Performed at [facility], [address] [place], [place] [address]   CBC     Status: Abnormal   Collection Time: 07/29/17  9:16 PM  Result Value Ref Range   WBC 17.6 (H) 3.6 - 11.0 K/uL   RBC 3.01 (L) 3.80 - 5.20 MIL/uL   Hemoglobin 9.5 (L) 12.0 - 16.0 g/dL   HCT 03.4 (L) 74.2 - 59.5 %   MCV 93.3 80.0 - 100.0 fL   MCH 31.7 26.0 - 34.0 pg   MCHC 34.0 32.0 - 36.0 g/dL   RDW 63.8 75.6 - 43.3 %   Platelets 230 150 - 440 K/uL    Comment: Performed at [facility], [address] [place], [place] [address]  Prepare RBC     Status: None   Collection Time: 07/30/17  1:36 AM  Result Value Ref Range   Order Confirmation      DUPLICATE REQUEST Performed at [facility], [address] [place], [place] [address]   CBC     Status: Abnormal   Collection Time: 07/30/17  4:51 AM  Result Value Ref Range   WBC 19.3 (H) 3.6 - 11.0 K/uL   RBC 3.10 (L) 3.80 - 5.20 MIL/uL   Hemoglobin 9.6 (L) 12.0 - 16.0 g/dL   HCT 06.3 (L) 01.6 - 01.0 %   MCV 89.5 80.0 - 100.0 fL   MCH 30.8 26.0 - 34.0 pg   MCHC 34.5 32.0 - 36.0 g/dL   RDW 93.2 35.5 - 73.2 %   Platelets 199 150 - 440 K/uL    Comment: Performed at [facility], [address] [place], [place] [address]     There is no immunization history on file for this patient.  Assessment:   27 y.o. [id number] PPD3 postoperativeday # 1 BTL with follow up X-lap for bleeding   Plan:  ) Acute blood loss anemia - hemodynamically stable and asymptomatic - start po ferrous sulfate - H&H stable this AM following 2U of pRBC - may saline lock and  discontinue foley  2) Blood Type --/--/A POS (06/18 1250) / Rubella 1.89 (11/27 1538) / Varicella Immune  3) TDAP status declines  4) Feeding plan breast  5)  Education given regarding options for contraception, as well as compatibility with breast feeding if applicable.  Patient plans on tubal ligation for contraception.  6) Disposition anticipate POD2-3    Vena Austria, MD, [facility] Frederick [facility], [facility] 07/30/2017, 8:26 AM

## 2017-07-30 NOTE — Progress Notes (Signed)
Attempted to get patient out of bed.  Patient reported feeling lightheaded and weak.  Vital signs WDL and color remained good.  Patient verbalizes understanding to call for assistance before attempting to ambulate again. Reynold BowenSusan Paisley Mohamedamin Nifong, RN 07/30/2017 3:41 PM

## 2017-07-30 NOTE — OR Nursing (Addendum)
Verbal Report received by Irving BurtonEmily BP RN, Consents Verified for surgery and blood products, via Dr. Pernell DupreAdams and Dr. Bonney AidStaebler verbal order to start blood transfusion obtained, Einar GipJenny Harris, PACU RN to obtain and start transfusion  while operating room is being prepped for patient.

## 2017-07-30 NOTE — Plan of Care (Signed)
  Problem: Education: Goal: Knowledge of the prescribed therapeutic regimen will improve 07/30/2017 1053 by Reynold BowenHedrick, Alois Colgan Paisley, RN Outcome: Progressing 07/30/2017 1053 by Reynold BowenHedrick, Ann Groeneveld Paisley, RN Outcome: Progressing

## 2017-07-30 NOTE — Progress Notes (Signed)
Patient arrived back to mother-baby unit from PACU. Patient A&O, VSS. Foley and on-q pump in place. Honeycomb dressing on lower transverse abdomen clean, dry, and intact. Family at bedside. Will continue to monitor.

## 2017-07-30 NOTE — Anesthesia Postprocedure Evaluation (Signed)
Anesthesia Post Note  Patient: Cristina Le  Procedure(s) Performed: POST PARTUM TUBAL LIGATION (N/A )  Patient location during evaluation: PACU Anesthesia Type: General Level of consciousness: awake and alert Pain management: pain level controlled Vital Signs Assessment: post-procedure vital signs reviewed and stable Respiratory status: spontaneous breathing, nonlabored ventilation, respiratory function stable and patient connected to nasal cannula oxygen Cardiovascular status: blood pressure returned to baseline and stable Postop Assessment: no apparent nausea or vomiting Anesthetic complications: no     Last Vitals:  Vitals:   07/30/17 1953 07/30/17 2055  BP: 110/69 118/86  Pulse: (!) 101 72  Resp: 18 18  Temp: 36.5 C 36.9 C  SpO2: 100% 98%    Last Pain:  Vitals:   07/30/17 2055  TempSrc: Oral  PainSc:                  Lenard SimmerAndrew Ladarian Bonczek

## 2017-07-30 NOTE — Op Note (Signed)
Preoperative Diagnosis: 1) 27 y.o. G3P3003 at 2020w0d postoperative day 0 postpartum tubal ligation 2) Postoperative hypotension  3) Hemoperitoneum on ultrasound  Postoperative Diagnosis: 1) 26 y.o. [id number] at 5120w0d postoperative day 0 postpartum tubal ligation 2) Postoperative hypotension  3) Hemoperitoneum on ultrasound  Operation Performed: Exploratory laparotomy and suture ligation of the right proximal fallopian tube  Indication: Postoperative hypotension with complex fluid on abdominal ultrasound  Anesthesia: .General  Primary Surgeon: Vena AustriaAndreas Kahdijah Errickson, MD  Preoperative Antibiotics: 2g Ancef  Estimated Blood Loss: 2000 mL  IV Fluids:  1250mL of crystaloid 250mL of albumin 1 Unit packed red blood cells  Drains or Tubes: Foley to gravity drainage, ON-Q catheter system  Implants: none  Specimens Removed: none  Complications: none  Intraoperative Findings:  Normal ovaries and uterus.  Left tubal segments intact within the 2 previously placed chromic suture ties.  Right distal tubal segment within the 2 previously placed chromic suture ties with right proximal tubal segment pulled loose with active bleeding.    Patient Condition: stable  Procedure in Detail:  Patient was taken to the operating room were she was administered regional anesthesia.  She was positioned in the supine position, prepped and draped in the  Usual sterile fashion.  Prior to proceeding with the case a time out was performed and the level of anesthetic was checked and noted to be adequate.  The infraumbilical incision that had previously been employed for the tubal ligation was reopened.  Inspection revealed blood clot at the fundus of the uterus indicating need to proceed with laparotomy.  Utilizing the scalpel a pfannenstiel skin incision was made 2cm above the pubic symphysis and carried down sharply to the the level of the rectus fascia.  The fascia was incised in the midline using the scalpel and then  extended using mayo scissors.  The superior border of the rectus fascia was grasped with two Kocher clamps and the underlying rectus muscles were dissected of the fascia using blunt dissection.  The median raphae was incised using Mayo scissors.   The inferior border of the rectus fascia was dissected of the rectus muscles in a similar fashion.  The midline was identified, the peritoneum was entered bluntly and expanded using manual tractions.  The uterus was exteriorized, and inspection revealed the right proximal tubal segment to be the site of bleeding.  The proximal tubal segment and mesosalpinx were oversown with a 2-0 Vicryl in a running locked fashion.  The suture was tied of by incorporating the distal tubal segment and a fore and aft tie.  The left tubal segment while hemostatic was also had an additional 0 Vicryl tie placed.  The uterus was returned to the abdomen and blood clots were manually removed from the upper abdomen.  A pool suction was used to remove any liquid blood remaining.  The abdomen and pelvis were irrigated with warm saline.  Both tubes were re-inspected and noted to be hemostatic.     The rectus muscles were inspected noted to be hemostatic.  The superior border of the rectus fascia was grasped with a Kocher clamp.  The ON-Q trocars were then placed 4cm above the superior border of the incision and tunneled subfascially.  The introducers were removed and the catheters were threaded through the sleeves after which the sleeves were removed.  The fascia was closed using a looped #1 PDS in a running fashion taking 1cm by 1cm bites.  The subcutaneous tissue was irrigated using warm saline, hemostasis achieved using the bovie.  The subcutaneous dead space was less than 3cm and was not closed.  The skin was closed using Insorb staples.  The supraumbilical incision was closed using a 2-0 Vicryl in a running fashion for the fascia, and a 4-0 Monocryl in a subcuticular fashion.  Sponge needle  and instrument counts were corrects times two.  The patient tolerated the procedure well and was taken to the recovery room in stable condition.

## 2017-07-30 NOTE — Transfer of Care (Signed)
Immediate Anesthesia Transfer of Care Note  Patient: Cristina Le  Procedure(s) Performed: EXPLORATORY LAPAROTOMY (N/A Abdomen)  Patient Location: PACU  Anesthesia Type:General  Level of Consciousness: sedated and responds to stimulation  Airway & Oxygen Therapy: Patient Spontanous Breathing and Patient connected to face mask oxygen  Post-op Assessment: Report given to RN and Post -op Vital signs reviewed and stable  Post vital signs: Reviewed and stable  Last Vitals:  Vitals Value Taken Time  BP 97/49 07/30/2017 12:44 AM  Temp    Pulse 75 07/30/2017 12:44 AM  Resp 19 07/30/2017 12:44 AM  SpO2 100 % 07/30/2017 12:44 AM  Vitals shown include unvalidated device data.  Last Pain:  Vitals:   07/29/17 2252  TempSrc: Temporal  PainSc:       Patients Stated Pain Goal: 0 (07/29/17 2000)  Complications: No apparent anesthesia complications

## 2017-07-30 NOTE — Progress Notes (Signed)
Subjective:  Comfortable, pain improved.  Minimal lochia.   Objective:  Blood pressure (!) 96/57, pulse 73, temperature 98.5 F (36.9 C), temperature source Oral, resp. rate 16, height 5\' 8"  (1.727 m), weight 177 lb (80.3 kg), last menstrual period 10/16/2016, SpO2 97 %, unknown if currently breastfeeding.  Intake/Output      06/20 0701 - 06/21 0700   I.V. (mL/kg) 2050 (25.5)   Blood 640   IV Piggyback 250   Total Intake(mL/kg) 2940 (36.6)   Urine (mL/kg/hr) 655 (0.3)   Blood 2005   Total Output 2660   Net +280       Urine Occurrence 1 x     General: NAD Pulmonary: no increased work of breathing Extremities: no edema, no erythema, no tenderness  Results for orders placed or performed during the hospital encounter of 07/27/17 (from the past 72 hour(s))  CBC     Status: Abnormal   Collection Time: 07/27/17 12:50 PM  Result Value Ref Range   WBC 10.7 3.6 - 11.0 K/uL   RBC 3.80 3.80 - 5.20 MIL/uL   Hemoglobin 12.2 12.0 - 16.0 g/dL   HCT 16.134.9 (L) 09.635.0 - 04.547.0 %   MCV 91.8 80.0 - 100.0 fL   MCH 32.1 26.0 - 34.0 pg   MCHC 34.9 32.0 - 36.0 g/dL   RDW 40.914.0 81.111.5 - 91.414.5 %   Platelets 204 150 - 440 K/uL    Comment: Performed at [facility], [address] [place], [place] [address]  RPR     Status: None   Collection Time: 07/27/17 12:50 PM  Result Value Ref Range   RPR Ser Ql Non Reactive Non Reactive    Comment: (NOTE) Performed At: [facility]  [address] [place], [place] [id number] Jolene SchimkeNagendra Sanjai MD [phone] Performed at [facility], [address] [place], [place] [address]   Type and screen     Status: None (Preliminary result)   Collection Time: 07/27/17 12:50 PM  Result Value Ref Range   ABO/RH(D) A POS    Antibody Screen NEG    Sample Expiration      07/30/2017 Performed at [facility], [address] [place], [place] [address]    Unit Number [id number]    Blood Component Type RED CELLS,LR    Unit  division 00    Status of Unit ISSUED    Transfusion Status OK TO TRANSFUSE    Crossmatch Result Compatible    Unit Number [id number]    Blood Component Type RED CELLS,LR    Unit division 00    Status of Unit ISSUED    Transfusion Status OK TO TRANSFUSE    Crossmatch Result Compatible    Unit Number [id number]    Blood Component Type RED CELLS,LR    Unit division 00    Status of Unit ALLOCATED    Transfusion Status OK TO TRANSFUSE    Crossmatch Result Compatible    Unit Number [id number]    Blood Component Type RBC LR PHER1    Unit division 00    Status of Unit ALLOCATED    Transfusion Status OK TO TRANSFUSE    Crossmatch Result Compatible   CBC     Status: Abnormal   Collection Time: 07/28/17  4:57 AM  Result Value Ref Range   WBC 15.3 (H) 3.6 - 11.0 K/uL   RBC 3.96 3.80 - 5.20 MIL/uL   Hemoglobin 12.6 12.0 - 16.0 g/dL   HCT 20.236.5 54.235.0 - 70.647.0 %  MCV 92.1 80.0 - 100.0 fL   MCH 31.8 26.0 - 34.0 pg   MCHC 34.5 32.0 - 36.0 g/dL   RDW 16.1 09.6 - 04.5 %   Platelets 192 150 - 440 K/uL    Comment: Performed at [facility], [address] [place], [place] [address]  CBC     Status: Abnormal   Collection Time: 07/29/17  8:15 PM  Result Value Ref Range   WBC 19.3 (H) 3.6 - 11.0 K/uL   RBC 3.42 (L) 3.80 - 5.20 MIL/uL   Hemoglobin 10.6 (L) 12.0 - 16.0 g/dL   HCT 19.1 (L) 47.8 - 29.5 %   MCV 94.3 80.0 - 100.0 fL   MCH 31.0 26.0 - 34.0 pg   MCHC 32.8 32.0 - 36.0 g/dL   RDW 62.1 30.8 - 65.7 %   Platelets 223 150 - 440 K/uL    Comment: Performed at [facility], [address] [place], [place] [address]  Prepare RBC     Status: None   Collection Time: 07/29/17  9:07 PM  Result Value Ref Range   Order Confirmation      ORDER PROCESSED BY BLOOD BANK Performed at [facility], [address] [place], [place] [address]   CBC     Status: Abnormal   Collection Time: 07/29/17  9:16 PM  Result Value Ref Range   WBC 17.6 (H) 3.6 - 11.0 K/uL     RBC 3.01 (L) 3.80 - 5.20 MIL/uL   Hemoglobin 9.5 (L) 12.0 - 16.0 g/dL   HCT 41.3 (L) 24.4 - 01.0 %   MCV 93.3 80.0 - 100.0 fL   MCH 31.7 26.0 - 34.0 pg   MCHC 34.0 32.0 - 36.0 g/dL   RDW 27.2 53.6 - 64.4 %   Platelets 230 150 - 440 K/uL    Comment: Performed at [facility], [address] [place], [place] [address]  Prepare RBC     Status: None   Collection Time: 07/30/17  1:36 AM  Result Value Ref Range   Order Confirmation      ORDER PROCESSED BY BLOOD BANK Performed at [facility], [address] [place], [place] [address]      There is no immunization history on file for this patient.  Assessment:   27 y.o. G3P2002 PPD#3 TSVD, POD1 BTL, postoperativeday # 1 exploratory laparotomy   Plan:  1) Acute blood loss anemia - hemodynamically stable and asymptomatic - po ferrous sulfate - repeat AM CBC pending - If CBC stable may discontinue foley  2) Blood Type --/--/A POS (06/18 1250) / Rubella 1.89 (11/27 1538) / Varicella Immune  3) TDAP status declines  4) Feeding plan breast  5)  Education given regarding options for contraception, as well as compatibility with breast feeding if applicable.  Patient plans on tubal ligation for contraception.  6) Disposition anticipate discharge in 1-2 days  Vena Austria, MD, [facility], [facility] 07/30/2017, 5:15 AM

## 2017-07-30 NOTE — Anesthesia Post-op Follow-up Note (Signed)
Anesthesia QCDR form completed.        

## 2017-07-31 LAB — CBC
HCT: 23.2 % — ABNORMAL LOW (ref 35.0–47.0)
Hemoglobin: 8 g/dL — ABNORMAL LOW (ref 12.0–16.0)
MCH: 31.4 pg (ref 26.0–34.0)
MCHC: 34.6 g/dL (ref 32.0–36.0)
MCV: 90.7 fL (ref 80.0–100.0)
Platelets: 174 10*3/uL (ref 150–440)
RBC: 2.55 MIL/uL — ABNORMAL LOW (ref 3.80–5.20)
RDW: 14.6 % — AB (ref 11.5–14.5)
WBC: 10 10*3/uL (ref 3.6–11.0)

## 2017-07-31 MED ORDER — BREAST MILK: ORAL | Status: DC

## 2017-07-31 NOTE — Plan of Care (Signed)
Wound care and On Q-pump explained, pt verbalized understanding of teaching.

## 2017-07-31 NOTE — Lactation Note (Signed)
Lactation Consultation Note  Patient Name: Allen KellBethany J Dowdell WUJWJ'XToday's Date: 07/31/2017  Observed mom breast feeding.  Brooklyn is gulping at the breast with about every suck once let down occurs.  Mom's breasts are hard, uncomfortable and warm to touch before feeding.  Demonstrated how to hand express before breast feeding to soften areola for deeper latch.  Demonstrated breast massage during breast feeding to relieve tension in breasts and to keep [place] awake and nutritively sucking at the breast. Brooklyn drained one breast in about 15 minutes, but other breast is still hard and painful.  Mom hand expressed just enough for comfort into colostrum bottles and will store in refrigerator when breast milk labels come up from pharmacy.  Mom never used electric pump with other 2 - only manual pump when needed.  Reviewed normal course of lactation and that milk amounts should regulate in 24 to 72 hours to Brooklyn's needs.  Mom confirmed that is what happened with other 2. She reports breast feeding first baby for 12 months and second baby for 15 months without complications.  Lactation name and number on white board and encouraged to call with questions, concerns or assistance.   Maternal Data    Feeding    LATCH Score                   Interventions    Lactation Tools Discussed/Used     Consult Status      Louis MeckelWilliams, Alysa Duca Kay 07/31/2017, 8:13 PM

## 2017-07-31 NOTE — Progress Notes (Signed)
Subjective:  Doing well.  Pain well controlled on po analgesics.  Has ambulated.  Tolerating general diet.  Lochia minimal   Objective:  Blood pressure 103/63, pulse 87, temperature 97.8 F (36.6 C), temperature source Oral, resp. rate 16, height 5\' 8"  (1.727 m), weight 177 lb (80.3 kg), last menstrual period 10/16/2016, SpO2 97 %, unknown if currently breastfeeding.  Intake/Output      06/21 0701 - 06/22 0700 06/22 0701 - 06/23 0700   P.O. 1515    I.V. (mL/kg) 2458.6 (30.6)    Blood     IV Piggyback     Total Intake(mL/kg) 3973.6 (49.5)    Urine (mL/kg/hr) 3930 (2)    Blood     Total Output 3930    Net +43.6           General: NAD Pulmonary: no increased work of breathing Abdomen: non-distended, non-tender, fundus firm at level of umbilicus Incision: Extremities: no edema, no erythema, no tenderness  Results for orders placed or performed during the hospital encounter of 07/27/17 (from the past 72 hour(s))  CBC     Status: Abnormal   Collection Time: 07/29/17  8:15 PM  Result Value Ref Range   WBC 19.3 (H) 3.6 - 11.0 K/uL   RBC 3.42 (L) 3.80 - 5.20 MIL/uL   Hemoglobin 10.6 (L) 12.0 - 16.0 g/dL   HCT 19.132.2 (L) 47.835.0 - 29.547.0 %   MCV 94.3 80.0 - 100.0 fL   MCH 31.0 26.0 - 34.0 pg   MCHC 32.8 32.0 - 36.0 g/dL   RDW 62.114.2 30.811.5 - 65.714.5 %   Platelets 223 150 - 440 K/uL    Comment: Performed at [facility], [address] [place], [place] [address]  Prepare RBC     Status: None   Collection Time: 07/29/17  9:07 PM  Result Value Ref Range   Order Confirmation      ORDER PROCESSED BY BLOOD BANK Performed at [facility], [address] [place], [place] [address]   CBC     Status: Abnormal   Collection Time: 07/29/17  9:16 PM  Result Value Ref Range   WBC 17.6 (H) 3.6 - 11.0 K/uL   RBC 3.01 (L) 3.80 - 5.20 MIL/uL   Hemoglobin 9.5 (L) 12.0 - 16.0 g/dL   HCT 41.328.1 (L) 24.435.0 - 01.047.0 %   MCV 93.3 80.0 - 100.0 fL   MCH 31.7 26.0 - 34.0 pg   MCHC 34.0 32.0  - 36.0 g/dL   RDW 27.214.1 53.611.5 - 64.414.5 %   Platelets 230 150 - 440 K/uL    Comment: Performed at [facility], [address] [place], [place] [address]  Prepare RBC     Status: None   Collection Time: 07/30/17  1:36 AM  Result Value Ref Range   Order Confirmation      DUPLICATE REQUEST Performed at [facility], [address] [place], [place] [address]   CBC     Status: Abnormal   Collection Time: 07/30/17  4:51 AM  Result Value Ref Range   WBC 19.3 (H) 3.6 - 11.0 K/uL   RBC 3.10 (L) 3.80 - 5.20 MIL/uL   Hemoglobin 9.6 (L) 12.0 - 16.0 g/dL   HCT 38.727.7 (L) 56.435.0 - 33.247.0 %   MCV 89.5 80.0 - 100.0 fL   MCH 30.8 26.0 - 34.0 pg   MCHC 34.5 32.0 - 36.0 g/dL   RDW 95.114.4 88.411.5 - 16.614.5 %   Platelets 199  150 - 440 K/uL    Comment: Performed at [facility], [address] [place], [place] [address]  CBC     Status: Abnormal   Collection Time: 07/31/17  5:43 AM  Result Value Ref Range   WBC 10.0 3.6 - 11.0 K/uL   RBC 2.55 (L) 3.80 - 5.20 MIL/uL   Hemoglobin 8.0 (L) 12.0 - 16.0 g/dL   HCT 19.1 (L) 47.8 - 29.5 %   MCV 90.7 80.0 - 100.0 fL   MCH 31.4 26.0 - 34.0 pg   MCHC 34.6 32.0 - 36.0 g/dL   RDW 62.1 (H) 30.8 - 65.7 %   Platelets 174 150 - 440 K/uL    Comment: Performed at [facility], [address] [place], [place] [address]     There is no immunization history on file for this patient.  Assessment:   27 y.o. [id number] PPD4 postoperativeday # 2 BTL, then X-lap for hemoperitoneum   Plan:  ) Acute blood loss anemia - hemodynamically stable and asymptomatic.  Status post 2 units of pRBC intraoperatively.  2 additional units on standby - po ferrous sulfate - repeat CBC in AM  2) Blood Type --/--/A POS (06/18 1250) / Rubella 1.89 (11/27 1538) / Varicella Immune  3) TDAP status declines  4) Feeding plan breast  5)  Education given regarding options for contraception, as well as compatibility with breast feeding if applicable.  Patient  plans on tubal ligation for contraception.  6) Disposition anticipate discharge tomorrow  Vena Austria, MD, [facility], [facility] 07/31/2017, 7:57 AM

## 2017-08-01 LAB — BPAM RBC
BLOOD PRODUCT EXPIRATION DATE: 201907112359
Blood Product Expiration Date: 201907112359
Blood Product Expiration Date: 201907112359
Blood Product Expiration Date: 201907112359
ISSUE DATE / TIME: 201906202250
ISSUE DATE / TIME: 201906202352
Unit Type and Rh: 6200
Unit Type and Rh: 6200
Unit Type and Rh: 6200
Unit Type and Rh: 6200

## 2017-08-01 LAB — HEMOGLOBIN AND HEMATOCRIT, BLOOD
HEMATOCRIT: 27 % — AB (ref 35.0–47.0)
HEMOGLOBIN: 9.4 g/dL — AB (ref 12.0–16.0)

## 2017-08-01 LAB — TYPE AND SCREEN
ABO/RH(D): A POS
ANTIBODY SCREEN: NEGATIVE
UNIT DIVISION: 0
UNIT DIVISION: 0
Unit division: 0
Unit division: 0

## 2017-08-01 LAB — CBC
HCT: 22.1 % — ABNORMAL LOW (ref 35.0–47.0)
HEMATOCRIT: 26.6 % — AB (ref 35.0–47.0)
Hemoglobin: 7.6 g/dL — ABNORMAL LOW (ref 12.0–16.0)
Hemoglobin: 9.1 g/dL — ABNORMAL LOW (ref 12.0–16.0)
MCH: 31.1 pg (ref 26.0–34.0)
MCH: 31.3 pg (ref 26.0–34.0)
MCHC: 34.3 g/dL (ref 32.0–36.0)
MCHC: 34.2 g/dL (ref 32.0–36.0)
MCV: 90.7 fL (ref 80.0–100.0)
MCV: 91.5 fL (ref 80.0–100.0)
PLATELETS: 181 10*3/uL (ref 150–440)
PLATELETS: 217 10*3/uL (ref 150–440)
RBC: 2.44 MIL/uL — ABNORMAL LOW (ref 3.80–5.20)
RBC: 2.91 MIL/uL — ABNORMAL LOW (ref 3.80–5.20)
RDW: 14.6 % — ABNORMAL HIGH (ref 11.5–14.5)
RDW: 14.2 % (ref 11.5–14.5)
WBC: 8.2 10*3/uL (ref 3.6–11.0)
WBC: 9.5 10*3/uL (ref 3.6–11.0)

## 2017-08-01 LAB — PREPARE RBC (CROSSMATCH)

## 2017-08-01 MED ORDER — SODIUM CHLORIDE 0.9% IV SOLUTION
Freq: Once | INTRAVENOUS | Status: AC
  Administered 2017-08-01: 18:00:00 via INTRAVENOUS

## 2017-08-01 MED ORDER — OXYCODONE-ACETAMINOPHEN 5-325 MG PO TABS: 1.0000 | ORAL_TABLET | ORAL | 0 refills | Status: DC | PRN

## 2017-08-01 MED ORDER — FUSION 65-65-25-30 MG PO CAPS: 1.0000 | ORAL_CAPSULE | Freq: Every day | ORAL | 11 refills | Status: DC

## 2017-08-01 NOTE — Progress Notes (Signed)
Pt complaind of dizziness, feeling faint at 1315. Pt sitting on side of bed, pale, clammy. VS obtained, WNL. MD notified. Orthostatic vitals requested and obtained. CBC ordered STAT. Orders to follow. Pt back in bed, resting with family at bedside.

## 2017-08-01 NOTE — Progress Notes (Signed)
Obstetric and Gynecology  Subjective  Felt weak and dizzy when packing this afternoon.  Also some increased swelling bilateral lower extremitieis  Objective   Vitals:   08/01/17 0348 08/01/17 0828  BP: (!) 99/56 100/69  Pulse: 72 79  Resp: 16 20  Temp: 97.9 F (36.6 C) 97.8 F (36.6 C)  SpO2: 99% 100%     Intake/Output Summary (Last 24 hours) at 08/01/2017 1328 Last data filed at 08/01/2017 1019 Gross per 24 hour  Intake 600 ml  Output 800 ml  Net -200 ml    General: NAD Abdomen: soft, non-tender, non-distended Extremities: no edema  Labs: Results for orders placed or performed during the hospital encounter of 07/27/17 (from the past 24 hour(s))  CBC     Status: Abnormal   Collection Time: 08/01/17  5:48 AM  Result Value Ref Range   WBC 8.2 3.6 - 11.0 K/uL   RBC 2.44 (L) 3.80 - 5.20 MIL/uL   Hemoglobin 7.6 (L) 12.0 - 16.0 g/dL   HCT 16.122.1 (L) 09.635.0 - 04.547.0 %   MCV 90.7 80.0 - 100.0 fL   MCH 31.1 26.0 - 34.0 pg   MCHC 34.3 32.0 - 36.0 g/dL   RDW 40.914.6 (H) 81.111.5 - 91.414.5 %   Platelets 181 150 - 440 K/uL    Cultures: Results for orders placed or performed in visit on 06/25/17  GC/Chlamydia Probe Amp([facility])     Status: None   Collection Time: 06/25/17  3:12 PM  Result Value Ref Range Status   Chlamydia trachomatis, NAA Negative Negative Final   Neisseria gonorrhoeae by PCR Negative Negative Final  Strep Gp B NAA     Status: None   Collection Time: 06/25/17  3:12 PM  Result Value Ref Range Status   Strep Gp B NAA Negative Negative Final    Comment: Centers for Disease Control and Prevention (CDC) and American Congress of Obstetricians and Gynecologists (ACOG) guidelines for prevention of perinatal group B streptococcal (GBS) disease specify co-collection of a vaginal and rectal swab specimen to maximize sensitivity of GBS detection. Per the CDC and ACOG, swabbing both the lower vagina and rectum substantially increases the yield of detection compared with sampling  the vagina alone. Penicillin G, ampicillin, or cefazolin are indicated for intrapartum prophylaxis of perinatal GBS colonization. Reflex susceptibility testing should be performed prior to use of clindamycin only on GBS isolates from penicillin-allergic women who are considered a high risk for anaphylaxis. Treatment with vancomycin without additional testing is warranted if resistance to clindamycin is noted.     Imaging:  Assessment   27 y.o. G3P2002 POD3 BTL, and X-lap hemoperitoneum  Plan   1) Symptomatic anemia - feels weak and dizzy with increased ambulation today while packing.  Orthostatics negative.   - transfuse additional 1 unit of packed red blood cells - pre and post transfusion CBC ordered - given timing of post transfusion CBC anticipate discharge tomorrow

## 2017-08-02 LAB — TYPE AND SCREEN
ABO/RH(D): A POS
ANTIBODY SCREEN: NEGATIVE
UNIT DIVISION: 0

## 2017-08-02 LAB — BPAM RBC
Blood Product Expiration Date: 201907112359
ISSUE DATE / TIME: 201906231742
UNIT TYPE AND RH: 6200

## 2017-08-02 LAB — SURGICAL PATHOLOGY

## 2017-08-02 NOTE — Progress Notes (Signed)
Pt with infant taken to visitor entrance via wheelchair by hospital volunteer to go home.

## 2017-08-02 NOTE — Progress Notes (Signed)
Provided and reviewed discharge paperwork and prescriptions. Utilized teach back method, pt/significant other verbalized understanding of instructions provided. Follow up appointment made for Friday, 6/28 at 10:20am. OnQ pump removed. Honeycomb dressing remains intact. VS stable. Pt to shower and then discharge to visitor entrance to go home transported by husband.

## 2017-08-04 ENCOUNTER — Telehealth: Payer: Self-pay

## 2017-08-04 ENCOUNTER — Other Ambulatory Visit: Payer: Self-pay | Admitting: Obstetrics and Gynecology

## 2017-08-04 MED ORDER — FUSION 65-65-25-30 MG PO CAPS: 1.0000 | ORAL_CAPSULE | Freq: Every day | ORAL | 11 refills | Status: DC

## 2017-08-04 NOTE — Telephone Encounter (Signed)
Pt called stating "I feel super weak like I cant even walk across the floor without feeling like Im going to pass out"  Pt asked if Dr.Staebler would call her and let her know what to do.

## 2017-08-05 NOTE — Progress Notes (Signed)
Postoperative Follow-up Patient presents post op from postpartum tubal ligation complicated by postoperative hemorrhage requiring emergency laparotomy for repair of tubal bleeding, 3 units of pRBC 1weeks ago for abnormal uterine bleeding and requested sterilization.  Subjective: Patient reports some improvement in her preop symptoms. Eating a regular diet without difficulty. Pain is controlled without any medications.  Activity: still somewhat limited and easily fatigued  Objective: Vitals: Blood pressure (!) 96/58, pulse (!) 103, height 5\' 8"  (1.727 m), weight 161 lb (73 kg), last menstrual period 10/16/2016, SpO2 99 %, unknown if currently breastfeeding.  Last menstrual period 10/16/2016, unknown if currently breastfeeding.  General: NAD HEENT: normocephalic, anicteric Pulmonary: no increased work of breathing Abdomen: soft, non-tender, non-distended, umbilical and pfannenstiel incision D/C/I Ext: no edema  Admission on 07/27/2017, Discharged on 08/02/2017  Component Date Value Ref Range Status  . WBC 07/27/2017 10.7  3.6 - 11.0 K/uL Final  . RBC 07/27/2017 3.80  3.80 - 5.20 MIL/uL Final  . Hemoglobin 07/27/2017 12.2  12.0 - 16.0 g/dL Final  . HCT 16/11/9602 34.9* 35.0 - 47.0 % Final  . MCV 07/27/2017 91.8  80.0 - 100.0 fL Final  . MCH 07/27/2017 32.1  26.0 - 34.0 pg Final  . MCHC 07/27/2017 34.9  32.0 - 36.0 g/dL Final  . RDW 54/10/8117 14.0  11.5 - 14.5 % Final  . Platelets 07/27/2017 204  150 - 440 K/uL Final   Performed at [facility], [address], [place], [place] [address]  . RPR Ser Ql 07/27/2017 Non Reactive  Non Reactive Final   Comment: (NOTE) Performed At: [facility] [address] [place], [place] [id number] Jolene Schimke MD VH:[id number] Performed at [facility], [address] [place] [place], [place] [address]   . ABO/RH(D) 07/27/2017 A POS   Final  . Antibody Screen 07/27/2017 NEG   Final  . Sample Expiration 07/27/2017  07/30/2017   Final  . Unit Number 07/27/2017 [id number]   Final  . Blood Component Type 07/27/2017 RED CELLS,LR   Final  . Unit division 07/27/2017 00   Final  . Status of Unit 07/27/2017 ISSUED,FINAL   Final  . Transfusion Status 07/27/2017 OK TO TRANSFUSE   Final  . Crossmatch Result 07/27/2017 Compatible   Final  . Unit Number 07/27/2017 [id number]   Final  . Blood Component Type 07/27/2017 RED CELLS,LR   Final  . Unit division 07/27/2017 00   Final  . Status of Unit 07/27/2017 ISSUED,FINAL   Final  . Transfusion Status 07/27/2017 OK TO TRANSFUSE   Final  . Crossmatch Result 07/27/2017 Compatible   Final  . Unit Number 07/27/2017 [id number]   Final  . Blood Component Type 07/27/2017 RED CELLS,LR   Final  . Unit division 07/27/2017 00   Final  . Status of Unit 07/27/2017 REL FROM [facility]   Final  . Transfusion Status 07/27/2017 OK TO TRANSFUSE   Final  . Crossmatch Result 07/27/2017    Final                   Value:Compatible Performed at [facility], [address] [place], [place] [address]   . Unit Number 07/27/2017 [id number]   Final  . Blood Component Type 07/27/2017 RBC LR PHER1   Final  . Unit division 07/27/2017 00   Final  . Status of Unit 07/27/2017 REL FROM [facility]   Final  . Transfusion Status 07/27/2017 OK TO TRANSFUSE   Final  . Crossmatch Result 07/27/2017  Compatible   Final  . WBC 07/28/2017 15.3* 3.6 - 11.0 K/uL Final  . RBC 07/28/2017 3.96  3.80 - 5.20 MIL/uL Final  . Hemoglobin 07/28/2017 12.6  12.0 - 16.0 g/dL Final  . HCT 28/41/324406/19/2019 36.5  35.0 - 47.0 % Final  . MCV 07/28/2017 92.1  80.0 - 100.0 fL Final  . MCH 07/28/2017 31.8  26.0 - 34.0 pg Final  . MCHC 07/28/2017 34.5  32.0 - 36.0 g/dL Final  . RDW 01/02/725306/19/2019 13.8  11.5 - 14.5 % Final  . Platelets 07/28/2017 192  150 - 440 K/uL Final   Performed at [facility], [address] [place], [place] [address]  . SURGICAL PATHOLOGY 07/29/2017    Final                    Value:Surgical Pathology CASE: [id number] [facility]     SPECIMEN SUBMITTED: A. Segment tube, right and left  CLINICAL HISTORY: Post partum tubal ligation  PRE-OPERATIVE DIAGNOSIS: None provided  POST-OPERATIVE DIAGNOSIS: None provided.     DIAGNOSIS: A. FALLOPIAN TUBE RIGHT AND LEFT; TUBAL LIGATION: - TWO SEGMENTS OF FALLOPIAN TUBE WITH FULL CROSS-SECTIONS OF THE LUMENS EXAMINED.   GROSS DESCRIPTION: A. Labeled: Right tube segment and left tube segment Received: In formalin Size: 1.7 x 0.5 x 0.6 cm and 2.2 x 0.6 x 0.5 cm Other findings: Purple-tan tubular fragments (one marked blue for orientation)  Block summary: 1-2 - representative cross-sections   Final Diagnosis performed by Elijah Birkara Rubinas, MD.   Electronically signed 08/02/2017 12:23:53PM The electronic signature indicates that the named Attending Pathologist has evaluated the specimen  Technical component performed at [facility], [address], [place]                         n, [place] [address] Lab: [phone] Dir: Jolene SchimkeSanjai Nagendra, MD, MMM  Professional component performed at [facility], [address], [place], [place] [address] Lab: [phone] Dir: Georgiann Cockerara C. Rubinas, MD   . WBC 07/29/2017 19.3* 3.6 - 11.0 K/uL Final  . RBC 07/29/2017 3.42* 3.80 - 5.20 MIL/uL Final  . Hemoglobin 07/29/2017 10.6* 12.0 - 16.0 g/dL Final  . HCT 57/32/202506/20/2019 32.2* 35.0 - 47.0 % Final  . MCV 07/29/2017 94.3  80.0 - 100.0 fL Final  . MCH 07/29/2017 31.0  26.0 - 34.0 pg Final  . MCHC 07/29/2017 32.8  32.0 - 36.0 g/dL Final  . RDW 42/70/623706/20/2019 14.2  11.5 - 14.5 % Final  . Platelets 07/29/2017 223  150 - 440 K/uL Final   Performed at [facility], [address] [place], [place] [address]  . WBC 07/29/2017 17.6* 3.6 - 11.0 K/uL Final  . RBC 07/29/2017 3.01* 3.80 - 5.20 MIL/uL Final  . Hemoglobin 07/29/2017 9.5* 12.0 - 16.0 g/dL Final  . HCT  51/76/160706/20/2019 28.1* 35.0 - 47.0 % Final  . MCV 07/29/2017 93.3  80.0 - 100.0 fL Final  . MCH 07/29/2017 31.7  26.0 - 34.0 pg Final  . MCHC 07/29/2017 34.0  32.0 - 36.0 g/dL Final  . RDW 37/10/626906/20/2019 14.1  11.5 - 14.5 % Final  . Platelets 07/29/2017 230  150 - 440 K/uL Final   Performed at [facility], [address] [place], [place] [address]  . Order Confirmation 07/29/2017    Final                   Value:ORDER  PROCESSED BY BLOOD BANK Performed at [facility], [address] [place] [place], [place] [address]   . ISSUE DATE / TIME 07/27/2017 [id number]   Final  . Blood Product Unit Number 07/27/2017 [id number]   Final  . PRODUCT CODE 07/27/2017 [id number]   Final  . Unit Type and Rh 07/27/2017 6200   Final  . Blood Product Expiration Date 07/27/2017 [id number]   Final  . ISSUE DATE / TIME 07/27/2017 [id number]   Final  . Blood Product Unit Number 07/27/2017 [id number]   Final  . PRODUCT CODE 07/27/2017 [id number]   Final  . Unit Type and Rh 07/27/2017 6200   Final  . Blood Product Expiration Date 07/27/2017 [id number]   Final  . Blood Product Unit Number 07/27/2017 [id number]   Final  . Unit Type and Rh 07/27/2017 6200   Final  . Blood Product Expiration Date 07/27/2017 [id number]   Final  . Blood Product Unit Number 07/27/2017 [id number]   Final  . Unit Type and Rh 07/27/2017 6200   Final  . Blood Product Expiration Date 07/27/2017 [id number]   Final  . WBC 07/30/2017 19.3* 3.6 - 11.0 K/uL Final  . RBC 07/30/2017 3.10* 3.80 - 5.20 MIL/uL Final  . Hemoglobin 07/30/2017 9.6* 12.0 - 16.0 g/dL Final  . HCT 70/35/0093 27.7* 35.0 - 47.0 % Final  . MCV 07/30/2017 89.5  80.0 - 100.0 fL Final  . MCH 07/30/2017 30.8  26.0 - 34.0 pg Final  . MCHC 07/30/2017 34.5  32.0 - 36.0 g/dL Final  . RDW 81/82/9937 14.4  11.5 - 14.5 % Final  . Platelets 07/30/2017 199  150 - 440 K/uL Final   Performed at [facility], [address] [place], [place] [address]   . Order Confirmation 07/30/2017    Final                   Value:DUPLICATE REQUEST Performed at [facility], [address] [place] [place], [place] [address]   . WBC 07/31/2017 10.0  3.6 - 11.0 K/uL Final  . RBC 07/31/2017 2.55* 3.80 - 5.20 MIL/uL Final  . Hemoglobin 07/31/2017 8.0* 12.0 - 16.0 g/dL Final  . HCT 01/75/1025 23.2* 35.0 - 47.0 % Final  . MCV 07/31/2017 90.7  80.0 - 100.0 fL Final  . MCH 07/31/2017 31.4  26.0 - 34.0 pg Final  . MCHC 07/31/2017 34.6  32.0 - 36.0 g/dL Final  . RDW 85/27/7824 14.6* 11.5 - 14.5 % Final  . Platelets 07/31/2017 174  150 - 440 K/uL Final   Performed at [facility], [address], [place], [place] [address]  . WBC 08/01/2017 8.2  3.6 - 11.0 K/uL Final  . RBC 08/01/2017 2.44* 3.80 - 5.20 MIL/uL Final  . Hemoglobin 08/01/2017 7.6* 12.0 - 16.0 g/dL Final  . HCT 14/43/1540 22.1* 35.0 - 47.0 % Final  . MCV 08/01/2017 90.7  80.0 - 100.0 fL Final  . MCH 08/01/2017 31.1  26.0 - 34.0 pg Final  . MCHC 08/01/2017 34.3  32.0 - 36.0 g/dL Final  . RDW 08/67/6195 14.6* 11.5 - 14.5 % Final  . Platelets 08/01/2017 181  150 - 440 K/uL Final   Performed at [facility], [address] [place], [place] [address]  . WBC 08/01/2017 9.5  3.6 - 11.0 K/uL Final  . RBC 08/01/2017 2.91* 3.80 - 5.20 MIL/uL Final  . Hemoglobin 08/01/2017 9.1* 12.0 - 16.0 g/dL Final  . HCT 71/24/5809 26.6* 35.0 - 47.0 % Final  .  MCV 08/01/2017 91.5  80.0 - 100.0 fL Final  . MCH 08/01/2017 31.3  26.0 - 34.0 pg Final  . MCHC 08/01/2017 34.2  32.0 - 36.0 g/dL Final  . RDW 16/11/9602 14.2  11.5 - 14.5 % Final  . Platelets 08/01/2017 217  150 - 440 K/uL Final   Performed at [facility], [address] [place], [place] [address]  . ABO/RH(D) 08/01/2017 A POS   Final  . Antibody Screen 08/01/2017 NEG   Final  . Sample Expiration 08/01/2017 08/04/2017   Final  . Unit Number 08/01/2017 [id number]   Final  . Blood Component Type 08/01/2017 RBC LR PHER1    Final  . Unit division 08/01/2017 00   Final  . Status of Unit 08/01/2017 ISSUED,FINAL   Final  . Transfusion Status 08/01/2017 OK TO TRANSFUSE   Final  . Crossmatch Result 08/01/2017    Final                   Value:Compatible Performed at [facility], [address] [place], [place] [address]   . Order Confirmation 08/01/2017    Final                   Value:ORDER PROCESSED BY BLOOD BANK Performed at [facility], [address] [place] [place], [place] [address]   . ISSUE DATE / TIME 08/01/2017 [id number]   Final  . Blood Product Unit Number 08/01/2017 [id number]   Final  . PRODUCT CODE 08/01/2017 [id number]   Final  . Unit Type and Rh 08/01/2017 6200   Final  . Blood Product Expiration Date 08/01/2017 [id number]   Final  . Hemoglobin 08/01/2017 9.4* 12.0 - 16.0 g/dL Final  . HCT 18/84/1660 27.0* 35.0 - 47.0 % Final   Performed at [facility], [address] [place] [place], [place] [address]    Assessment: 27 y.o. s/p postpartum BTL followed by hemorrhage requiring emergent laparotomy stable  Plan: Patient has done well after surgery with no apparent complications.  I have discussed the post-operative course to date, and the expected progress moving forward.  The patient understands what complications to be concerned about.  I will see the patient in routine follow up, or sooner if needed.    Activity plan: No heavy lifting.  Hgb 11.8 today, given samples of fusion plus   Vena Austria, MD, [facility], [facility] 08/05/2017, 8:52 PM

## 2017-08-06 ENCOUNTER — Ambulatory Visit (INDEPENDENT_AMBULATORY_CARE_PROVIDER_SITE_OTHER): Payer: Medicaid Other | Admitting: Obstetrics and Gynecology

## 2017-08-06 VITALS — BP 96/58 | HR 103 | Ht 68.0 in | Wt 161.0 lb

## 2017-08-06 DIAGNOSIS — D649 Anemia, unspecified: Secondary | ICD-10-CM

## 2017-08-06 DIAGNOSIS — Z4889 Encounter for other specified surgical aftercare: Secondary | ICD-10-CM

## 2017-08-06 LAB — POCT HEMOGLOBIN: Hemoglobin: 11.5 g/dL — AB (ref 12.2–16.2)

## 2017-08-10 NOTE — Anesthesia Postprocedure Evaluation (Signed)
Anesthesia Post Note  Patient: Cristina Le  Procedure(s) Performed: EXPLORATORY LAPAROTOMY (N/A Abdomen)  Patient location during evaluation: PACU Anesthesia Type: General Level of consciousness: awake and alert Pain management: pain level controlled Vital Signs Assessment: post-procedure vital signs reviewed and stable Respiratory status: spontaneous breathing, nonlabored ventilation, respiratory function stable and patient connected to nasal cannula oxygen Cardiovascular status: blood pressure returned to baseline and stable Postop Assessment: no apparent nausea or vomiting Anesthetic complications: no     Last Vitals:  Vitals:   08/01/17 2351 08/02/17 0746  BP: 108/76 109/87  Pulse: 79 74  Resp: 16 18  Temp: 37 C 36.8 C  SpO2: 98% 98%    Last Pain:  Vitals:   08/02/17 1108  TempSrc:   PainSc: 3                  Yevette EdwardsJames G Jacaden Forbush

## 2017-09-10 ENCOUNTER — Other Ambulatory Visit (HOSPITAL_COMMUNITY)
Admission: RE | Admit: 2017-09-10 | Discharge: 2017-09-10 | Disposition: A | Discharge status: Home / Self Care | Payer: Medicaid Other | Source: Ambulatory Visit | Attending: Obstetrics and Gynecology | Admitting: Obstetrics and Gynecology

## 2017-09-10 ENCOUNTER — Ambulatory Visit (INDEPENDENT_AMBULATORY_CARE_PROVIDER_SITE_OTHER): Payer: Medicaid Other | Admitting: Obstetrics and Gynecology

## 2017-09-10 ENCOUNTER — Encounter: Payer: Self-pay | Admitting: Obstetrics and Gynecology

## 2017-09-10 DIAGNOSIS — Z124 Encounter for screening for malignant neoplasm of cervix: Secondary | ICD-10-CM | POA: Diagnosis present

## 2017-09-10 NOTE — Progress Notes (Signed)
Postpartum Visit  Chief Complaint:  Chief Complaint  Patient presents with  . Postpartum Care    Vaginal delivery 6/18    History of Present Illness: Patient is a 27 y.o. Z6X0960 presents for postpartum visit.  Review the Delivery Report for details.  Delivery Note On 07/27/2017 at 10:39 PM a viable female was delivered via Vaginal,  APGAR: 8, 9; weight 6 lb 13.4 oz (3100 g).   Episiotomy No.  Laceration: no  Pregnancy or labor problems:  no Any problems since the delivery:  No  Hemorrhage following BTL requiring exploratory laparotomy  Newborn Details:  SINGLETON :  Breast or formula feeding: plans to breastfeed Intercourse: No  Contraception after delivery: Yes  Any bowel or bladder issues: No  Post partum depression/anxiety noted:  no Edinburgh Post-Partum Depression Score: 5 Date of last PAP: 04/2015  no abnormalities   Review of Systems: Review of Systems  Constitutional: Negative for chills and fever.  HENT: Negative for congestion.   Respiratory: Negative for cough and shortness of breath.   Cardiovascular: Negative for chest pain and palpitations.  Gastrointestinal: Negative for abdominal pain, constipation, diarrhea, heartburn, nausea and vomiting.  Genitourinary: Negative for dysuria, frequency and urgency.  Skin: Negative for itching and rash.  Neurological: Negative for dizziness and headaches.  Endo/Heme/Allergies: Negative for polydipsia.  Psychiatric/Behavioral: Negative for depression.    The following portions of the patient's history were reviewed and updated as appropriate: allergies, current medications, past family history, past medical history, past social history, past surgical history and problem list.  Past Medical History:  Past Medical History:  Diagnosis Date  . Anaphylactic reaction   . History of Papanicolaou smear of cervix 01/14/12; 04/11/15   NEG; NEG, CT/GC/TR NEG;    Past Surgical History:  Past Surgical History:  Procedure  Laterality Date  . LAPAROTOMY N/A 07/29/2017   Procedure: EXPLORATORY LAPAROTOMY;  Surgeon: Vena Austria, MD;  Location: [facility] ORS;  Service: Gynecology;  Laterality: N/A;  . NO PAST SURGERIES    . TUBAL LIGATION N/A 07/29/2017   Procedure: POST PARTUM TUBAL LIGATION;  Surgeon: Vena Austria, MD;  Location: [facility] ORS;  Service: Gynecology;  Laterality: N/A;    Family History:  Family History  Problem Relation Age of Onset  . Breast cancer Maternal Grandmother 45  . Heart disease Paternal Grandfather     Social History:  Social History   Socioeconomic History  . Marital status: Married    Spouse name: Not on file  . Number of children: 2  . Years of education: 50  . Highest education level: Not on file  Occupational History  . 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   Social Needs  . Financial resource strain: Not on file  . Food insecurity:    Worry: Not on file    Inability: Not on file  . Transportation needs:    Medical: Not on file    Non-medical: Not on file  Tobacco Use  . Smoking status: Never Smoker  . Smokeless tobacco: Never Used  Substance and Sexual Activity  . Alcohol use: No  . Drug use: No  . Sexual activity: Yes  Lifestyle  . Physical activity:    Days per week: Not on file    Minutes per session: Not on file  . Stress: Not on file  Relationships  . Social connections:    Talks on phone: Not on file  Gets together: Not on file    Attends religious service: Not on file    Active member of club or organization: Not on file    Attends meetings of clubs or organizations: Not on file    Relationship status: Not on file  . Intimate partner violence:    Fear of current or ex partner: Not on file    Emotionally abused: Not on file    Physically abused: Not on file    Forced sexual activity: Not on file  Other Topics Concern  . Not on file  Social History Narrative  . Not on file     Allergies:  Allergies  Allergen Reactions  . Fish Allergy Anaphylaxis  . Fish-Derived Products Anaphylaxis    Medications: Prior to Admission medications   Medication Sig Start Date End Date Taking? Authorizing Provider  EPINEPHrine (EPIPEN 2-PAK) 0.3 mg/0.3 mL IJ SOAJ injection 1 IM INJECTION AS NEEDED 07/23/10   [provider]  Fe Fum-Fe Poly-Vit C-Lactobac (FUSION) 65-65-25-30 MG CAPS Take 1 tablet by mouth daily. Patient not taking: Reported on 08/06/2017 08/04/17   Vena Austria, MD  ibuprofen (ADVIL,MOTRIN) 600 MG tablet Take 1 tablet (600 mg total) by mouth every 6 (six) hours. 07/29/17   Vena Austria, MD  oxyCODONE-acetaminophen (PERCOCET/ROXICET) 5-325 MG tablet Take 1 tablet by mouth every 4 (four) hours as needed (pain scale 4-7). 08/01/17   Vena Austria, MD    Physical Exam Blood pressure (!) 100/52, pulse 84, height 5\' 8"  (1.727 m), weight 160 lb (72.6 kg), last menstrual period 10/16/2016, currently breastfeeding.    General: NAD HEENT: normocephalic, anicteric Pulmonary: No increased work of breathing Abdomen: NABS, soft, non-tender, non-distended.  Umbilicus without lesions.  No hepatomegaly, splenomegaly or masses palpable. No evidence of hernia. Genitourinary:  External: Normal external female genitalia.  Normal urethral meatus, normal  Bartholin's and Skene's glands.    Vagina: Normal vaginal mucosa, no evidence of prolapse.    Cervix: Grossly normal in appearance, no bleeding  Uterus: Non-enlarged, mobile, normal contour.  No CMT  Adnexa: ovaries non-enlarged, no adnexal masses  Rectal: deferred Extremities: no edema, erythema, or tenderness Neurologic: Grossly intact Psychiatric: mood appropriate, affect full  Edinburgh Postnatal Depression Scale - 09/10/17 1123      Edinburgh Postnatal Depression Scale:  In the Past 7 Days   I have been able to laugh and see the funny side of things.  0    I have looked forward with enjoyment to  things.  0    I have blamed myself unnecessarily when things went wrong.  1    I have been anxious or worried for no good reason.  1    I have felt scared or panicky for no good reason.  0    Things have been getting on top of me.  1    I have been so unhappy that I have had difficulty sleeping.  0    I have felt sad or miserable.  1    I have been so unhappy that I have been crying.  1    The thought of harming myself has occurred to me.  0    Edinburgh Postnatal Depression Scale Total  5       Assessment: 27 y.o. Z6X0960 presenting for 6 week postpartum visit  Plan: Problem List Items Addressed This Visit    None    Visit Diagnoses    Encounter for postpartum visit    -  Primary  Relevant Orders   Cytology - PAP   Screening for malignant neoplasm of cervix       Relevant Orders   Cytology - PAP       1) Contraception - Education given regarding options for contraception, as well as compatibility with breast feeding if applicable.  Patient plans on tubal ligation for contraception.  2)  Pap - ASCCP guidelines and rational discussed.  ASCCP guidelines and rational discussed.  Patient opts for every 3 years screening interval  3) Patient underwent screening for postpartum depression with no signs of depression  4) Return in about 1 year (around 09/11/2018) for annual.   Vena AustriaAndreas Toniesha Zellner, MD, [facility] FrederickFACOG [facility], [facility] 09/10/2017, 11:42 AM

## 2017-09-14 LAB — CYTOLOGY - PAP: Diagnosis: NEGATIVE

## 2017-12-30 ENCOUNTER — Telehealth: Payer: Self-pay

## 2017-12-30 NOTE — Telephone Encounter (Signed)
Spoke w/patient. She has a sore spot on both breast. They are not red or hot to touch, just tender. She hasn't had fever that she is aware of but has felt flu like symptoms/not feeling well today. She has taken a shower & massaged the sore areas. Inquiring at what point would she need to be seen.

## 2017-12-30 NOTE — Telephone Encounter (Signed)
Patient is breast feeding. She woke up this morning with pain in both of her breast. She is uncertain with some of her symptoms if it is mastitis or not. Requesting a [phone]

## 2017-12-31 NOTE — Telephone Encounter (Signed)
Called pt to follow up from message about breast pain. Please schedule her an appt when she calls back

## 2018-01-04 NOTE — Telephone Encounter (Signed)
If she still has symptoms agree needs to be seen for possible mastitis

## 2018-01-04 NOTE — Telephone Encounter (Signed)
Pt states she is feeling much better and did some natural remedies to help. She will call if she starts to feel this way again, pt aware of $78 visit since she does not have insurance now

## 2019-03-03 IMAGING — US US ABDOMEN LIMITED
1 series · 4 of 4 positions shown · non-contrast
Comparison: None.

CLINICAL DATA: Hypotension.  Postpartum tubal ligation.

EXAM:
LIMITED ABDOMEN ULTRASOUND FOR ASCITES
TECHNIQUE: Limited ultrasound survey for ascites was performed in all four
abdominal quadrants.

[Series 1: us abdomen limited · 4 of 4 slices shown]
[im 1/4]
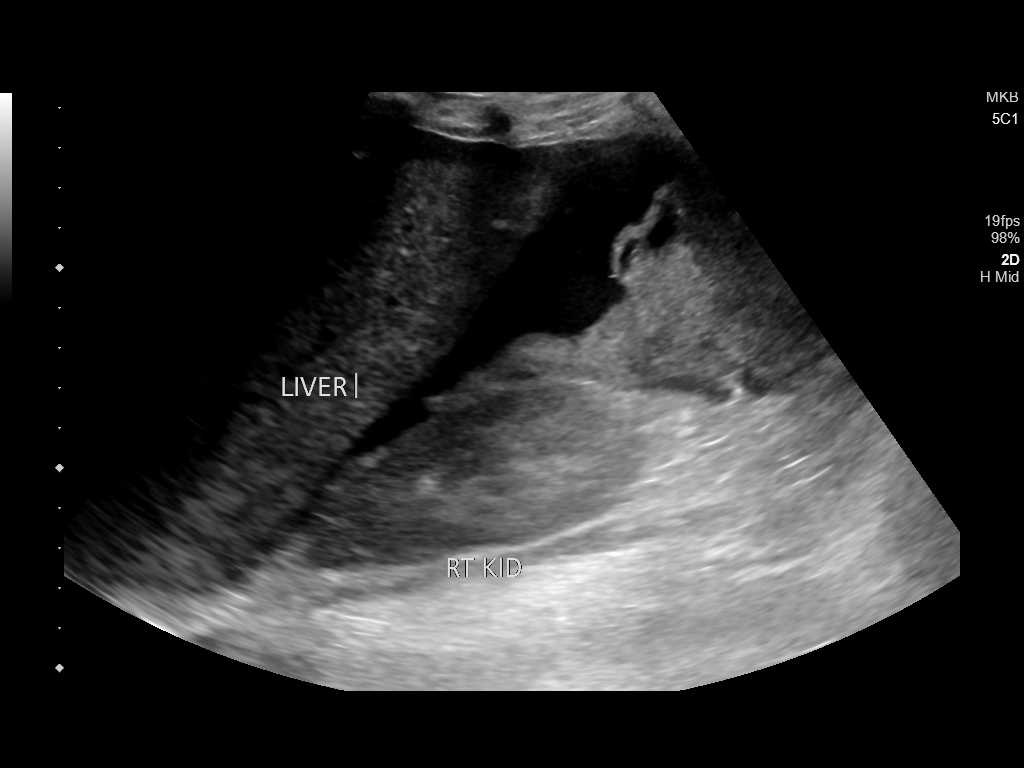
[im 2/4]
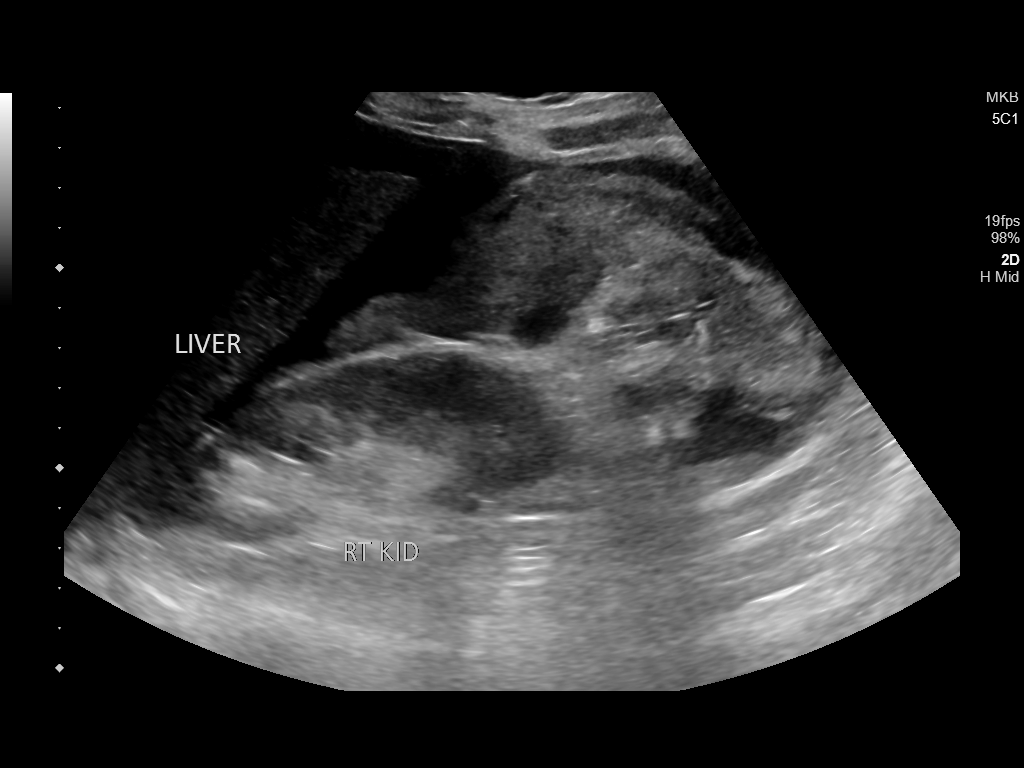
[im 3/4]
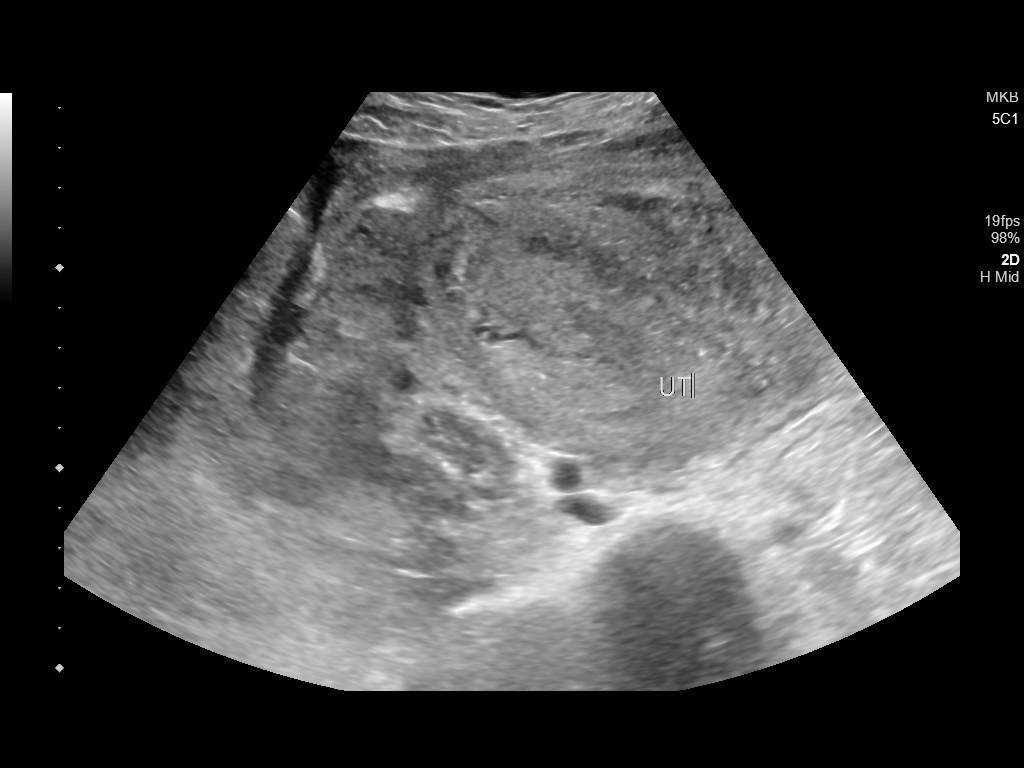
[im 4/4]
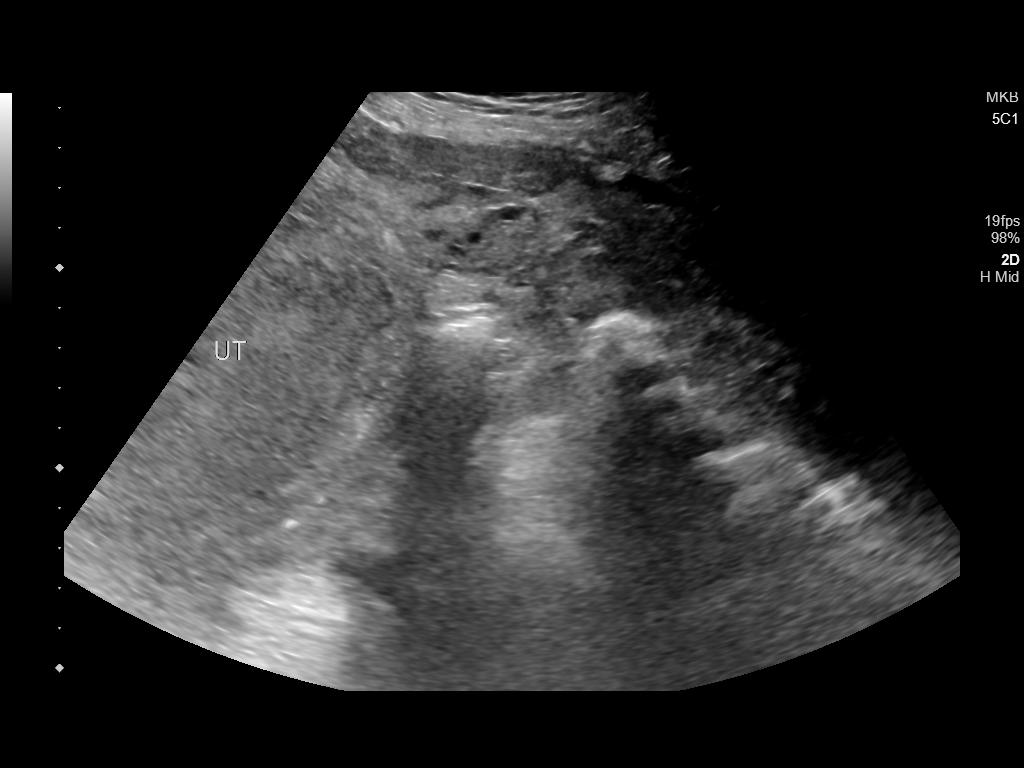

[4 of 4 positions shown; findings below may reference images not displayed]

FINDINGS: Small amount anechoic ascites. Large complex heterogeneous echogenic
fluid collections in the pelvis extending to RIGHT upper quadrant.
IMPRESSION: Large complex fluid collection in the pelvis highly concerning for
hemoperitoneum. Small volume ascites.

## 2020-02-21 ENCOUNTER — Other Ambulatory Visit: Payer: Self-pay

## 2020-02-21 ENCOUNTER — Emergency Department
Admission: EM | Admit: 2020-02-21 | Discharge: 2020-02-22 | Disposition: A | Discharge status: Home / Self Care | Payer: Medicaid Other | Attending: Emergency Medicine | Admitting: Emergency Medicine

## 2020-02-21 ENCOUNTER — Telehealth: Payer: Self-pay | Admitting: Obstetrics and Gynecology

## 2020-02-21 ENCOUNTER — Encounter: Payer: Self-pay | Admitting: Emergency Medicine

## 2020-02-21 DIAGNOSIS — R109 Unspecified abdominal pain: Secondary | ICD-10-CM | POA: Insufficient documentation

## 2020-02-21 LAB — POC URINE PREG, ED: Preg Test, Ur: NEGATIVE

## 2020-02-21 LAB — COMPREHENSIVE METABOLIC PANEL
ALT: 13 U/L (ref 0–44)
AST: 14 U/L — ABNORMAL LOW (ref 15–41)
Albumin: 4.3 g/dL (ref 3.5–5.0)
Alkaline Phosphatase: 37 U/L — ABNORMAL LOW (ref 38–126)
Anion gap: 8 (ref 5–15)
BUN: 14 mg/dL (ref 6–20)
CO2: 25 mmol/L (ref 22–32)
Calcium: 9.7 mg/dL (ref 8.9–10.3)
Chloride: 105 mmol/L (ref 98–111)
Creatinine, Ser: 0.87 mg/dL (ref 0.44–1.00)
GFR, Estimated: >60 mL/min (ref >60)
Glucose, Bld: 93 mg/dL (ref 70–99)
Potassium: 4.5 mmol/L (ref 3.5–5.1)
Sodium: 138 mmol/L (ref 135–145)
Total Bilirubin: 0.7 mg/dL (ref 0.3–1.2)
Total Protein: 7.6 g/dL (ref 6.5–8.1)

## 2020-02-21 LAB — URINALYSIS, COMPLETE (UACMP) WITH MICROSCOPIC
Bacteria, UA: NONE SEEN
Bilirubin Urine: NEGATIVE
Glucose, UA: NEGATIVE mg/dL
Hgb urine dipstick: NEGATIVE
Ketones, ur: NEGATIVE mg/dL
Leukocytes,Ua: NEGATIVE
Nitrite: NEGATIVE
Protein, ur: NEGATIVE mg/dL
Specific Gravity, Urine: 1.015 (ref 1.005–1.030)
pH: 9 — ABNORMAL HIGH (ref 5.0–8.0)

## 2020-02-21 LAB — CBC
HCT: 39.5 % (ref 36.0–46.0)
Hemoglobin: 13.7 g/dL (ref 12.0–15.0)
MCH: 30.5 pg (ref 26.0–34.0)
MCHC: 34.7 g/dL (ref 30.0–36.0)
MCV: 88 fL (ref 80.0–100.0)
Platelets: 313 10*3/uL (ref 150–400)
RBC: 4.49 MIL/uL (ref 3.87–5.11)
RDW: 12.1 % (ref 11.5–15.5)
WBC: 8.6 10*3/uL (ref 4.0–10.5)
nRBC: 0 % (ref 0.0–0.2)

## 2020-02-21 LAB — LIPASE, BLOOD: Lipase: 51 U/L (ref 11–51)

## 2020-02-21 MED ORDER — DICYCLOMINE HCL 20 MG PO TABS: 20.0000 mg | ORAL_TABLET | Freq: Three times a day (TID) | ORAL | 0 refills | Status: DC | PRN

## 2020-02-21 MED ORDER — DICYCLOMINE HCL 10 MG PO CAPS
20.0000 mg | ORAL_CAPSULE | Freq: Once | ORAL | Status: DC
  Filled 2020-02-21: qty 2

## 2020-02-21 NOTE — ED Triage Notes (Signed)
Pt comes into the ED via POV c/o lower abdominal cramping that has been ongoing x a couple months intermittently.  Pt states that denies zany N/V and is having normal bowel movements.  Pt unable to sit still in the triage area at this time due to the pain and discomfort. Pt was positive for trousseau's sign while BP cuff taking BP.  Pt unable to complete Blood pressure at this time. Pt has even and unlabored respirations.

## 2020-02-21 NOTE — ED Provider Notes (Signed)
[facility] Emergency Department Provider Note  ____________________________________________   Event Date/Time   First MD Initiated Contact with Patient 02/21/20 2300     (approximate)  I have reviewed the triage vital signs and the nursing notes.   HISTORY  Chief Complaint Abdominal Pain    HPI Cristina Le is a 30 y.o. female who is generally healthy with no contributory chronic medical issues who presents for evaluation of intermittent abdominal cramping for months.  She said that it occurs unpredictably, is not related to anything in particular even though it seems to be more common at night, and it seems to be getting worse over time.  She said that she has regular bowel movements, perhaps once a day or once every other day, with no bowel changes recently.  She denies nausea and vomiting.  She has no upper abdominal pain and her symptoms do not seem to be related to eating.  She denies fever/chills, sore throat, chest pain, shortness of breath, dysuria, vaginal bleeding (her last menstrual period was about a week ago), and excessive vaginal discharge.  She has no concerns for sexually transmitted infection.  She was concerned because she had a tubal ligation about 2 years ago and wondered if it could be a complication from that procedure.  Nothing in particular seems to make the symptoms better or worse.  She describes them as severe at times.  She was planning to follow-up with her GYN but has not yet made an appointment.         Past Medical History:  Diagnosis Date  . Anaphylactic reaction   . History of Papanicolaou smear of cervix 01/14/12; 04/11/15   NEG; NEG, CT/GC/TR NEG;    Patient Active Problem List   Diagnosis Date Noted  . 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 Allergic reaction, history of 01/13/2017  . Supervision of other normal pregnancy, antepartum 01/05/2017    Past Surgical History:  Procedure Laterality Date  . LAPAROTOMY N/A  07/29/2017   Procedure: EXPLORATORY LAPAROTOMY;  Surgeon: Vena Austria, MD;  Location: [facility] ORS;  Service: Gynecology;  Laterality: N/A;  . NO PAST SURGERIES    . TUBAL LIGATION N/A 07/29/2017   Procedure: POST PARTUM TUBAL LIGATION;  Surgeon: Vena Austria, MD;  Location: [facility] ORS;  Service: Gynecology;  Laterality: N/A;    Prior to Admission medications   Medication Sig Start Date End Date Taking? Authorizing Provider  dicyclomine (BENTYL) 20 MG tablet Take 1 tablet (20 mg total) by mouth 3 (three) times daily as needed for spasms. 02/21/20  Yes Loleta Rose, MD  EPINEPHrine (EPIPEN 2-PAK) 0.3 mg/0.3 mL IJ SOAJ injection 1 IM INJECTION AS NEEDED 07/23/10   [provider]  Fe Fum-Fe Poly-Vit C-Lactobac (FUSION) 65-65-25-30 MG CAPS Take 1 tablet by mouth daily. Patient not taking: Reported on 08/06/2017 08/04/17   Vena Austria, MD  ibuprofen (ADVIL,MOTRIN) 600 MG tablet Take 1 tablet (600 mg total) by mouth every 6 (six) hours. 07/29/17   Vena Austria, MD  oxyCODONE-acetaminophen (PERCOCET/ROXICET) 5-325 MG tablet Take 1 tablet by mouth every 4 (four) hours as needed (pain scale 4-7). 08/01/17   Vena Austria, MD    Allergies Fish allergy and Fish-derived products  Family History  Problem Relation Age of Onset  . Breast cancer Maternal Grandmother 30  . Heart disease Paternal Grandfather     Social History Social History   Tobacco Use  . Smoking status: Never Smoker  . Smokeless tobacco: Never Used  Vaping Use  .  Vaping Use: Never used  Substance Use Topics  . Alcohol use: No  . Drug use: No    Review of Systems Constitutional: No fever/chills Eyes: No visual changes. ENT: No sore throat. Cardiovascular: Denies chest pain. Respiratory: Denies shortness of breath. Gastrointestinal: Intermittent lower abdominal cramping as described above.  No history of nausea, vomiting, diarrhea, nor constipation. Genitourinary: Negative for  dysuria. Musculoskeletal: Negative for neck pain.  Negative for back pain. Integumentary: Negative for rash. Neurological: Negative for headaches, focal weakness or numbness.   ____________________________________________   PHYSICAL EXAM:  VITAL SIGNS: ED Triage Vitals  Enc Vitals Group     BP 02/21/20 2230 126/80     Pulse Rate 02/21/20 1901 86     Resp 02/21/20 1901 (!) 120     Temp 02/21/20 1901 98.3 F (36.8 C)     Temp Source 02/21/20 1901 Oral     SpO2 02/21/20 1901 100 %     Weight 02/21/20 1902 65.8 kg (145 lb)     Height 02/21/20 1902 1.727 m (5\' 8" )     Head Circumference --      Peak Flow --      Pain Score 02/21/20 1902 9     Pain Loc --      Pain Edu? --      Excl. in GC? --     Constitutional: Alert and oriented.  Eyes: Conjunctivae are normal.  Head: Atraumatic. Nose: No congestion/rhinnorhea. Mouth/Throat: Patient is wearing a mask. Neck: No stridor.  No meningeal signs.   Cardiovascular: Normal rate, regular rhythm. Good peripheral circulation. Respiratory: Normal respiratory effort.  No retractions. Gastrointestinal: Soft and nontender in all quadrants.  Nondistended. GU: Deferred Musculoskeletal: No lower extremity tenderness nor edema. No gross deformities of extremities. Neurologic:  Normal speech and language. No gross focal neurologic deficits are appreciated.  Skin:  Skin is warm, dry and intact. Psychiatric: Mood and affect are normal. Speech and behavior are normal.  ____________________________________________   LABS (all labs ordered are listed, but only abnormal results are displayed)  Labs Reviewed  COMPREHENSIVE METABOLIC PANEL - Abnormal; Notable for the following components:      Result Value   AST 14 (*)    Alkaline Phosphatase 37 (*)    All other components within normal limits  URINALYSIS, COMPLETE (UACMP) WITH MICROSCOPIC - Abnormal; Notable for the following components:   Color, Urine YELLOW (*)    APPearance CLOUDY (*)     pH 9.0 (*)    All other components within normal limits  LIPASE, BLOOD  CBC  POC URINE PREG, ED   ____________________________________________  EKG  No indication for emergent EKG ____________________________________________  RADIOLOGY I, 04/20/20, personally viewed and evaluated these images (plain radiographs) as part of my medical decision making, as well as reviewing the written report by the radiologist.  ED MD interpretation: No indication for emergent imaging  Official radiology report(s): No results found.  ____________________________________________   PROCEDURES   Procedure(s) performed (including Critical Care):  Procedures   ____________________________________________   INITIAL IMPRESSION / MDM / ASSESSMENT AND PLAN / ED COURSE  As part of my medical decision making, I reviewed the following data within the electronic MEDICAL RECORD NUMBER History obtained from family, Nursing notes reviewed and incorporated, Labs reviewed , Old chart reviewed, Notes from prior ED visits and [place] Controlled Substance Database   Differential diagnosis includes, but is not limited to, constipation, IBS, IBD, ovarian cyst/torsion, STD/PID, slow transit, SBO/ileus, UTI, less likely diverticulitis  or appendicitis or biliary colic.  Vital signs are stable and within normal limits.  Her lab work is all reassuring with an essentially normal comprehensive metabolic panel, negative urine pregnancy, normal CBC, and unremarkable urinalysis as well as a normal lipase.  Her abdominal exam is reassuring with no tenderness to palpation in any quadrant.  She has no GU complaints or concerns at this time.  Based on the patient's long-term symptoms and history, I suspect constipation and/or IBS.  I had a long talk with her and her husband about these diagnoses.  I am giving her a dose of Bentyl 20 mg by mouth tonight as well as a prescription, and I strongly encouraged her to use MiraLAX and  Colace for at least the next few days to see if she feels better after getting "cleaned out.  I also encouraged her to follow-up with OB/GYN and I gave her information about how to follow-up with gastroenterology as well.  I gave my usual and customary return precautions and she and her husband understand and agree with the plan.  There is no indication for emergent imaging tonight given the long term symptoms and the reassuring physical exam and medical work-up tonight.           ____________________________________________  FINAL CLINICAL IMPRESSION(S) / ED DIAGNOSES  Final diagnoses:  Abdominal cramping     MEDICATIONS GIVEN DURING THIS VISIT:  Medications  dicyclomine (BENTYL) capsule 20 mg (has no administration in time range)     ED Discharge Orders         Ordered    dicyclomine (BENTYL) 20 MG tablet  3 times daily PRN        02/21/20 2334          *Please note:  LEXII WALSH was evaluated in Emergency Department on 02/21/2020 for the symptoms described in the history of present illness. She was evaluated in the context of the global COVID-19 pandemic, which necessitated consideration that the patient might be at risk for infection with the SARS-CoV-2 virus that causes COVID-19. Institutional protocols and algorithms that pertain to the evaluation of patients at risk for COVID-19 are in a state of rapid change based on information released by regulatory bodies including the CDC and federal and state organizations. These policies and algorithms were followed during the patient's care in the ED.  Some ED evaluations and interventions may be delayed as a result of limited staffing during and after the pandemic.*  Note:  This document was prepared using Dragon voice recognition software and may include unintentional dictation errors.   Loleta Rose, MD 02/21/20 [phone]

## 2020-02-21 NOTE — Discharge Instructions (Signed)
As we discussed, your work-up was reassuring tonight.  We believe it is likely that you are suffering from symptoms of chronic constipation.  Try taking a daily dose of MiraLAX according to label instructions, along with a dose of Colace (a stool softener).  You may find that after using these products for couple of days that your symptoms have improved significantly.  Be sure to drink plenty of fluid to help flush out your system.  Alternatively it is possible that you may suffer from symptoms of irritable bowel syndrome (IBS), although this is typically a diagnosis provided by primary care doctor or gastroenterologist.  We provided a prescription for Bentyl which is a medication that can help with abdominal spasms and cramping.  Try using it according to label instructions and see if this helps.  While we recommend you follow-up with your provider at [facility], you may also consider calling the office of Dr. Servando Snare to schedule an appointment with him or one of his gastroenterology colleagues.  They may be able to assist you further with evaluation and treatment of your chronic abdominal pain/cramping.    Return to the emergency department if you develop new or worsening symptoms that concern you.

## 2020-02-21 NOTE — Telephone Encounter (Signed)
Pt called with complaints of pelvic pain, somewhat LLQ. Pain is all day, worse at night. Sx for a few months but increasing. Has about 3 wks out of 4 now. Also with some LBP. Hx of constipation/loose stools but not new for pt. S/p TL, not on hormones.   Suggested appt with GYN u/s. Pt is self pay. Will call to find out pricing.

## 2021-02-03 ENCOUNTER — Emergency Department
Admission: EM | Admit: 2021-02-03 | Discharge: 2021-02-03 | Discharge status: Against Medical Advice | Payer: Medicaid Other | Source: Home / Self Care

## 2021-02-03 ENCOUNTER — Ambulatory Visit: Admission: EM | Admit: 2021-02-03 | Discharge: 2021-02-03 | Payer: Medicaid Other

## 2021-02-03 ENCOUNTER — Other Ambulatory Visit: Payer: Self-pay

## 2021-02-03 DIAGNOSIS — K122 Cellulitis and abscess of mouth: Secondary | ICD-10-CM

## 2021-02-03 NOTE — Discharge Instructions (Addendum)
As we discussed, you have a large abscess on the roof of your mouth that I am unable to drain here in the urgent care.  It also needs further imaging to determine the extent of the abscess.  For that reason I recommend he go to [facility] ER in [place] where he can be evaluated by oral maxillofacial and have the abscess drained.

## 2021-02-03 NOTE — ED Provider Notes (Signed)
[facility]    CSN: [id number] Arrival date & time: 02/03/21  1919      History   Chief Complaint Chief Complaint  Patient presents with   Mouth Lesions    HPI JODDIE DIGIORGIO is a 30 y.o. female.   HPI  30 year old female here for evaluation of mouth lesion.  Patient reports that she has been experiencing a lesion of some sort on the top left roof of her mouth that has been growing in size and intensity all day.  She states she has not had a fever but she is also been taking Tylenol around-the-clock as needed for pain.  She states that a few days ago she thought she might have some tooth pain but that has largely resolved.  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   Allergic reaction, history of 01/13/2017   Supervision of other normal pregnancy, antepartum 01/05/2017    Past Surgical History:  Procedure Laterality Date   LAPAROTOMY N/A 07/29/2017   Procedure: EXPLORATORY LAPAROTOMY;  Surgeon: Vena Austria, MD;  Location: [facility] ORS;  Service: Gynecology;  Laterality: N/A;   NO PAST SURGERIES     TUBAL LIGATION N/A 07/29/2017   Procedure: POST PARTUM TUBAL LIGATION;  Surgeon: Vena Austria, MD;  Location: [facility] ORS;  Service: Gynecology;  Laterality: N/A;    OB History     Gravida  3   Para  2   Term  2   Preterm  0   AB  0   Living  2      SAB  0   IAB  0   Ectopic  0   Multiple  0   Live Births  2            Home Medications    Prior to Admission medications   Not on File    Family History Family History  Problem Relation Age of Onset   Breast cancer Maternal Grandmother 26   Heart disease Paternal Grandfather     Social History Social History   Tobacco Use   Smoking status: Never   Smokeless tobacco: Never   Vaping Use   Vaping Use: Never used  Substance Use Topics   Alcohol use: No   Drug use: No     Allergies   Fish allergy and Fish-derived products   Review of Systems Review of Systems  Constitutional:  Negative for activity change, appetite change and fever.  HENT:  Positive for mouth sores.   Hematological: Negative.   Psychiatric/Behavioral: Negative.      Physical Exam Triage Vital Signs ED Triage Vitals  Enc Vitals Group     BP 02/03/21 1930 111/83     Pulse Rate 02/03/21 1930 84     Resp 02/03/21 1930 18     Temp 02/03/21 1930 98.6 F (37 C)     Temp Source 02/03/21 1930 Oral     SpO2 02/03/21 1930 100 %     Weight 02/03/21 1928 150 lb (68 kg)     Height 02/03/21 1928 5\' 8"  (1.727 m)     Head Circumference --      Peak Flow --      Pain Score 02/03/21 1928 5     Pain Loc --  Pain Edu? --      Excl. in GC? --    No data found.  Updated Vital Signs BP 111/83 (BP Location: Left Arm)    Pulse 84    Temp 98.6 F (37 C) (Oral)    Resp 18    Ht 5\' 8"  (1.727 m)    Wt 150 lb (68 kg)    LMP 01/12/2021    SpO2 100%    BMI 22.81 kg/m   Visual Acuity Right Eye Distance:   Left Eye Distance:   Bilateral Distance:    Right Eye Near:   Left Eye Near:    Bilateral Near:     Physical Exam Vitals and nursing note reviewed.  Constitutional:      General: She is not in acute distress.    Appearance: Normal appearance. She is normal weight. She is not ill-appearing.  HENT:     Head: Normocephalic and atraumatic.     Mouth/Throat:     Mouth: Mucous membranes are moist.     Pharynx: Posterior oropharyngeal erythema present. No oropharyngeal exudate.  Skin:    General: Skin is warm and dry.     Capillary Refill: Capillary refill takes less than 2 seconds.     Findings: No erythema or rash.  Neurological:     General: No focal deficit present.     Mental Status: She is alert and oriented to person, place, and time.  Psychiatric:        Mood and Affect: Mood  normal.        Behavior: Behavior normal.        Thought Content: Thought content normal.        Judgment: Judgment normal.     UC Treatments / Results  Labs (all labs ordered are listed, but only abnormal results are displayed) Labs Reviewed - No data to display  EKG   Radiology No results found.  Procedures Procedures (including critical care time)  Medications Ordered in UC Medications - No data to display  Initial Impression / Assessment and Plan / UC Course  I have reviewed the triage vital signs and the nursing notes.  Pertinent labs & imaging results that were available during my care of the patient were reviewed by me and considered in my medical decision making (see chart for details).  Patient is a very pleasant, nontoxic-appearing 30 year old female here for evaluation of a lesion on the roof of her mouth that she states has been present and growing all day today.  She is unaware of any fevers but she has been taking Tylenol and ibuprofen to deal with the pain.  On exam patient has a very large abscess on the posterior lateral aspect of the hard palate.  It is fluctuant and erythematous in nature.  It does not rupture.  The patient does indicate that she started to have pain extending to her ear on the left and feels like it is becoming difficult to swallow.  Patient's airway is patent.  I have advised the patient that she has an abscess on her hard palate and it needs to be evaluated with further imaging to determine the extent of the lesion and then it will also need to be opened up and drained which is not something I can perform here in the urgent care.  For that reason, I have suggested she go to [facility] in [place].  Patient is agreeable and she is left in stable condition to go to [facility].  Final Clinical Impressions(s) / UC Diagnoses   Final diagnoses:  Cellulitis and abscess of mouth     Discharge Instructions      As we discussed, you have a  large abscess on the roof of your mouth that I am unable to drain here in the urgent care.  It also needs further imaging to determine the extent of the abscess.  For that reason I recommend he go to [facility] ER in [place] where he can be evaluated by oral maxillofacial and have the abscess drained.     ED Prescriptions   None    PDMP not reviewed this encounter.   Margarette Canada, NP 02/03/21 [phone]

## 2021-02-03 NOTE — ED Triage Notes (Signed)
Pt here with C/O ulcer in top left of mouth, states that it is growing and painful.

## 2021-02-03 NOTE — ED Notes (Addendum)
Patient is being discharged from the Urgent Care and sent to the Emergency Department via POV . Per Riki Rusk, NP, patient is in need of higher level of care due large abscess on palate. Patient is aware and verbalizes understanding of plan of care.  Vitals:   02/03/21 1930  BP: 111/83  Pulse: 84  Resp: 18  Temp: 98.6 F (37 C)  SpO2: 100%
